# Patient Record
Sex: Male | Born: 1950 | Race: White | Hispanic: No | Marital: Married | Smoking: Never smoker
Health system: Southern US, Community
[De-identification: ages and names within clinical notes are randomized; demographics above are authoritative.]

## PROBLEM LIST (undated history)

## (undated) DIAGNOSIS — N189 Chronic kidney disease, unspecified: Secondary | ICD-10-CM

## (undated) DIAGNOSIS — M109 Gout, unspecified: Secondary | ICD-10-CM

## (undated) DIAGNOSIS — T8859XA Other complications of anesthesia, initial encounter: Secondary | ICD-10-CM

## (undated) DIAGNOSIS — E669 Obesity, unspecified: Secondary | ICD-10-CM

## (undated) DIAGNOSIS — E119 Type 2 diabetes mellitus without complications: Secondary | ICD-10-CM

## (undated) DIAGNOSIS — I2589 Other forms of chronic ischemic heart disease: Secondary | ICD-10-CM

## (undated) DIAGNOSIS — T4145XA Adverse effect of unspecified anesthetic, initial encounter: Secondary | ICD-10-CM

## (undated) DIAGNOSIS — G4733 Obstructive sleep apnea (adult) (pediatric): Secondary | ICD-10-CM

## (undated) DIAGNOSIS — Z87442 Personal history of urinary calculi: Secondary | ICD-10-CM

## (undated) DIAGNOSIS — I2585 Chronic coronary microvascular dysfunction: Secondary | ICD-10-CM

## (undated) DIAGNOSIS — C801 Malignant (primary) neoplasm, unspecified: Secondary | ICD-10-CM

## (undated) DIAGNOSIS — I10 Essential (primary) hypertension: Secondary | ICD-10-CM

## (undated) DIAGNOSIS — E785 Hyperlipidemia, unspecified: Secondary | ICD-10-CM

## (undated) DIAGNOSIS — K219 Gastro-esophageal reflux disease without esophagitis: Secondary | ICD-10-CM

## (undated) DIAGNOSIS — C61 Malignant neoplasm of prostate: Secondary | ICD-10-CM

## (undated) HISTORY — DX: Essential (primary) hypertension: I10

## (undated) HISTORY — DX: Obstructive sleep apnea (adult) (pediatric): G47.33

## (undated) HISTORY — DX: Hyperlipidemia, unspecified: E78.5

## (undated) HISTORY — DX: Type 2 diabetes mellitus without complications: E11.9

## (undated) HISTORY — PX: PROSTATE SURGERY: SHX751

## (undated) HISTORY — DX: Obesity, unspecified: E66.9

---

## 1975-03-17 HISTORY — PX: CYST REMOVAL TRUNK: SHX6283

## 2003-03-17 HISTORY — PX: NECK SURGERY: SHX720

## 2003-04-03 ENCOUNTER — Ambulatory Visit (HOSPITAL_COMMUNITY): Admission: RE | Admit: 2003-04-03 | Discharge: 2003-04-04 | Payer: Self-pay | Admitting: Neurosurgery

## 2004-03-18 ENCOUNTER — Inpatient Hospital Stay: Payer: Self-pay | Admitting: Internal Medicine

## 2008-08-10 ENCOUNTER — Ambulatory Visit: Payer: Self-pay | Admitting: General Practice

## 2009-05-09 ENCOUNTER — Ambulatory Visit: Payer: Self-pay | Admitting: Family Medicine

## 2009-09-27 ENCOUNTER — Ambulatory Visit: Payer: Self-pay | Admitting: Urology

## 2009-10-29 ENCOUNTER — Ambulatory Visit: Payer: Self-pay | Admitting: Urology

## 2009-11-05 ENCOUNTER — Ambulatory Visit: Payer: Self-pay | Admitting: Urology

## 2009-11-12 ENCOUNTER — Ambulatory Visit: Payer: Self-pay | Admitting: Cardiovascular Disease

## 2010-08-01 ENCOUNTER — Ambulatory Visit: Payer: Self-pay | Admitting: Family Medicine

## 2012-10-21 DIAGNOSIS — E291 Testicular hypofunction: Secondary | ICD-10-CM | POA: Insufficient documentation

## 2012-10-21 DIAGNOSIS — N320 Bladder-neck obstruction: Secondary | ICD-10-CM | POA: Insufficient documentation

## 2012-10-21 DIAGNOSIS — N529 Male erectile dysfunction, unspecified: Secondary | ICD-10-CM | POA: Insufficient documentation

## 2012-10-21 DIAGNOSIS — C61 Malignant neoplasm of prostate: Secondary | ICD-10-CM | POA: Insufficient documentation

## 2012-10-21 DIAGNOSIS — R339 Retention of urine, unspecified: Secondary | ICD-10-CM | POA: Insufficient documentation

## 2013-06-14 ENCOUNTER — Emergency Department: Payer: Self-pay | Admitting: Internal Medicine

## 2014-09-21 ENCOUNTER — Other Ambulatory Visit: Payer: Self-pay | Admitting: Family Medicine

## 2014-12-17 ENCOUNTER — Encounter: Payer: Self-pay | Admitting: Family Medicine

## 2014-12-17 ENCOUNTER — Telehealth: Payer: Self-pay | Admitting: Family Medicine

## 2014-12-17 NOTE — Telephone Encounter (Signed)
Patient tried to schedule appointment for today but dr Rutherford Nail was completely booked. He is requesting a refill prescription for vyvanse and citalopram.

## 2014-12-20 ENCOUNTER — Telehealth: Payer: Self-pay

## 2014-12-20 NOTE — Telephone Encounter (Signed)
Pt called back to check on the status of a refill request he had made. I explained to pt that you had not seen him recently and that you have not written script for medications that he was requesting since 2014. He stated that he had been getting it filled some where else. And waiting a week to get an appointment was too long so he may look for another physician and to inform you.

## 2015-01-01 ENCOUNTER — Encounter: Payer: Self-pay | Admitting: Family Medicine

## 2015-01-01 ENCOUNTER — Ambulatory Visit (INDEPENDENT_AMBULATORY_CARE_PROVIDER_SITE_OTHER): Payer: Self-pay | Admitting: Family Medicine

## 2015-01-01 VITALS — BP 122/64 | HR 75 | Temp 97.9°F | Resp 16 | Ht 75.0 in | Wt 249.4 lb

## 2015-01-01 DIAGNOSIS — F419 Anxiety disorder, unspecified: Secondary | ICD-10-CM

## 2015-01-01 DIAGNOSIS — F909 Attention-deficit hyperactivity disorder, unspecified type: Secondary | ICD-10-CM

## 2015-01-01 DIAGNOSIS — F988 Other specified behavioral and emotional disorders with onset usually occurring in childhood and adolescence: Secondary | ICD-10-CM

## 2015-01-01 DIAGNOSIS — K580 Irritable bowel syndrome with diarrhea: Secondary | ICD-10-CM

## 2015-01-01 MED ORDER — LISDEXAMFETAMINE DIMESYLATE 70 MG PO CAPS: 70.0000 mg | ORAL_CAPSULE | Freq: Every day | ORAL | Status: DC

## 2015-01-01 MED ORDER — CITALOPRAM HYDROBROMIDE 20 MG PO TABS: 20.0000 mg | ORAL_TABLET | Freq: Every day | ORAL | Status: DC

## 2015-01-01 NOTE — Patient Instructions (Signed)
Irritable Bowel Syndrome, Adult Irritable bowel syndrome (IBS) is not one specific disease. It is a group of symptoms that affects the organs responsible for digestion (gastrointestinal or GI tract).  To regulate how your GI tract works, your body sends signals back and forth between your intestines and your brain. If you have IBS, there may be a problem with these signals. As a result, your GI tract does not function normally. Your intestines may become more sensitive and overreact to certain things. This is especially true when you eat certain foods or when you are under stress.  There are four types of IBS. These may be determined based on the consistency of your stool:   IBS with diarrhea.   IBS with constipation.   Mixed IBS.   Unsubtyped IBS.  It is important to know which type of IBS you have. Some treatments are more likely to be helpful for certain types of IBS.  CAUSES  The exact cause of IBS is not known. RISK FACTORS You may have a higher risk of IBS if:  You are a woman.  You are younger than 62 years old.  You have a family history of IBS.  You have mental health problems.  You have had bacterial infection of your GI tract. SIGNS AND SYMPTOMS  Symptoms of IBS vary from person to person. The main symptom is abdominal pain or discomfort. Additional symptoms usually include one or more of the following:   Diarrhea, constipation, or both.   Abdominal swelling or bloating.   Feeling full or sick after eating a small or regular-size meal.   Frequent gas.   Mucus in the stool.   A feeling of having more stool left after a bowel movement.  Symptoms tend to come and go. They may be associated with stress, psychiatric conditions, or nothing at all.  DIAGNOSIS  There is no specific test to diagnose IBS. Your health care provider will make a diagnosis based on a physical exam, medical history, and your symptoms. You may have other tests to rule out other  conditions that may be causing your symptoms. These may include:   Blood tests.   X-rays.   CT scan.  Endoscopy and colonoscopy. This is a test in which your GI tract is viewed with a long, thin, flexible tube. TREATMENT There is no cure for IBS, but treatment can help relieve symptoms. IBS treatment often includes:   Changes to your diet, such as:  Eating more fiber.  Avoiding foods that cause symptoms.  Drinking more water.  Eating regular, medium-sized portioned meals.  Medicines. These may include:  Fiber supplements if you have constipation.  Medicine to control diarrhea (antidiarrheal medicines).  Medicine to help control muscle spasms in your GI tract (antispasmodic medicines).  Medicines to help with any mental health issues, such as antidepressants or tranquilizers.  Therapy.  Talk therapy may help with anxiety, depression, or other mental health issues that can make IBS symptoms worse.  Stress reduction.  Managing your stress can help keep symptoms under control. HOME CARE INSTRUCTIONS   Take medicines only as directed by your health care provider.  Eat a healthy diet.  Avoid foods and drinks with added sugar.  Include more whole grains, fruits, and vegetables gradually into your diet. This may be especially helpful if you have IBS with constipation.  Avoid any foods and drinks that make your symptoms worse. These may include dairy products and caffeinated or carbonated drinks.  Do not eat large meals.    Drink enough fluid to keep your urine clear or pale yellow.  Exercise regularly. Ask your health care provider for recommendations of good activities for you.  Keep all follow-up visits as directed by your health care provider. This is important. SEEK MEDICAL CARE IF:   You have constant pain.  You have trouble or pain with swallowing.  You have worsening diarrhea. SEEK IMMEDIATE MEDICAL CARE IF:   You have severe and worsening abdominal  pain.   You have diarrhea and:   You have a rash, stiff neck, or severe headache.   You are irritable, sleepy, or difficult to awaken.   You are weak, dizzy, or extremely thirsty.   You have bright red blood in your stool or you have black tarry stools.   You have unusual abdominal swelling that is painful.   You vomit continuously.   You vomit blood (hematemesis).   You have both abdominal pain and a fever.    This information is not intended to replace advice given to you by your health care provider. Make sure you discuss any questions you have with your health care provider.   Document Released: 03/02/2005 Document Revised: 03/23/2014 Document Reviewed: 11/17/2013 Elsevier Interactive Patient Education 2016 [employer]  

## 2015-01-01 NOTE — Telephone Encounter (Signed)
Patient had appointment for today.  

## 2015-01-01 NOTE — Progress Notes (Addendum)
Name: Scott Skinner   MRN: [id number]    DOB: 1950-11-29   Date:01/01/2015       Progress Note  Subjective  Chief Complaint  Chief Complaint  Patient presents with  . ADD    pt here to discuss ADD medications    HPI  Attention deficit disorder  Patient has been on Vyvanse in the past for his ADD. This is working effectively abdomen to be able to focus and concentrate on his work and other responsibilities. There've been no insomnia. Evidence of mild decrease in his appetite which is welcome. IN THE PAST SHE'S ALSO BEEN ON ATIVAN OR LORAZEPAM AND THAT WAS TAPERED OFF BY HIS MOST RECENT PSYCHIATRIST. HE NOW HAS NO INSURANCE HIS WISHES TO HAVE his medications filled at this office  Irritable bowel syndrome  Patient has a long-standing history of intermittent bloating and gas diarrhea alternating with formed stools. There's been no mucus no blood. He is up-to-date on colonoscopy. Is independent better when he was on citalopram for mild anxiety.  Anxiety  Patient remotely had been on alprazolam for anxiety by a psychiatrist. This is discontinued and was placed on citalopram I second psychiatrist. He states this worked effectively and is here bowel syndrome been better during that time as    Past Medical History  Diagnosis Date  . Diabetes mellitus without complication ([place])   . Hyperlipidemia   . Hypertension   . OSA (obstructive sleep apnea)   . Obesity     Social History  Substance Use Topics  . Smoking status: Never Smoker   . Smokeless tobacco: Not on file  . Alcohol Use: No     Current outpatient prescriptions:  .  amLODipine (NORVASC) 10 MG tablet, take 1 tablet by mouth once daily, Disp: 30 tablet, Rfl: 1 .  benazepril (LOTENSIN) 20 MG tablet, , Disp: , Rfl: 0 .  Calcium Carbonate-Vitamin D (CALCIUM-VITAMIN D) 500-200 MG-UNIT tablet, Take 1 tablet by mouth daily., Disp: , Rfl:  .  esomeprazole (NEXIUM) 20 MG capsule, Take 20 mg by mouth daily at 12 noon., Disp:  , Rfl:  .  Grape Seed Extract 100 MG CAPS, Take by mouth., Disp: , Rfl:  .  loratadine (CLARITIN) 10 MG tablet, Take 10 mg by mouth daily., Disp: , Rfl:  .  metFORMIN (GLUCOPHAGE) 1000 MG tablet, , Disp: , Rfl: 0 .  Omega-3 1000 MG CAPS, Take by mouth., Disp: , Rfl:  .  Red Yeast Rice Extract 600 MG CAPS, Take by mouth., Disp: , Rfl:   Allergies  Allergen Reactions  . Other Anaphylaxis    ANESTHESIA     Review of Systems  Constitutional: Negative for fever, chills and weight loss.  HENT: Negative for congestion, hearing loss, sore throat and tinnitus.   Eyes: Negative for blurred vision, double vision and redness.  Respiratory: Negative for cough, hemoptysis and shortness of breath.   Cardiovascular: Negative for chest pain, palpitations, orthopnea, claudication and leg swelling.  Gastrointestinal: Positive for diarrhea. Negative for heartburn, nausea, vomiting, constipation and blood in stool.       Bloating and gaseous status recurrent  Genitourinary: Negative for dysuria, urgency, frequency and hematuria.  Musculoskeletal: Negative for myalgias, back pain, joint pain, falls and neck pain.  Skin: Negative for itching.  Neurological: Negative for dizziness, tingling, tremors, focal weakness, seizures, loss of consciousness, weakness and headaches.  Endo/Heme/Allergies: Does not bruise/bleed easily.  Psychiatric/Behavioral: Negative for depression and substance abuse. The patient is not nervous/anxious and does not  have insomnia.      Objective  Filed Vitals:   01/01/15 0930  BP: 122/64  Pulse: 75  Temp: 97.9 F (36.6 C)  Resp: 16  Height: 6\' 3"  (1.905 m)  Weight: 249 lb 7 oz (113.144 kg)  SpO2: 96%     Physical Exam  Constitutional: He is oriented to person, place, and time and well-developed, well-nourished, and in no distress.  Obese male in no acute distress  HENT:  Head: Normocephalic.  Eyes: EOM are normal. Pupils are equal, round, and reactive to light.   Neck: Normal range of motion. Neck supple. No thyromegaly present.  Cardiovascular: Normal rate, regular rhythm and normal heart sounds.   No murmur heard. Pulmonary/Chest: Effort normal and breath sounds normal. No respiratory distress. He has no wheezes.  Musculoskeletal: Normal range of motion. He exhibits no edema.  Lymphadenopathy:    He has no cervical adenopathy.  Neurological: He is alert and oriented to person, place, and time. No cranial nerve deficit. Gait normal. Coordination normal.  No tremor  Skin: Skin is warm and dry. No rash noted.  Psychiatric: Affect and judgment normal.  Mildly anxious and loquacious      Assessment & Plan   1. ADD (attention deficit disorder) Well-controlled on medication - lisdexamfetamine (VYVANSE) 70 MG capsule; Take 1 capsule (70 mg total) by mouth daily.  Dispense: 30 capsule; Refill: 0 - citalopram (CELEXA) 20 MG tablet; Take 1 tablet (20 mg total) by mouth daily.  Dispense: 30 tablet; Refill: 5  2. Acute anxiety Requiring higher dosage of citalopram  3. Irritable bowel syndrome with diarrhea Handout on IBS

## 2015-01-29 ENCOUNTER — Ambulatory Visit: Payer: Self-pay | Admitting: Family Medicine

## 2015-02-21 ENCOUNTER — Other Ambulatory Visit: Payer: Self-pay | Admitting: Family Medicine

## 2015-03-29 ENCOUNTER — Other Ambulatory Visit: Payer: Self-pay | Admitting: Family Medicine

## 2015-04-01 ENCOUNTER — Encounter: Payer: Self-pay | Admitting: Family Medicine

## 2015-04-03 ENCOUNTER — Ambulatory Visit: Payer: PRIVATE HEALTH INSURANCE | Admitting: Family Medicine

## 2015-04-23 ENCOUNTER — Encounter: Payer: Self-pay | Admitting: Family Medicine

## 2015-04-23 ENCOUNTER — Ambulatory Visit (INDEPENDENT_AMBULATORY_CARE_PROVIDER_SITE_OTHER): Payer: Self-pay | Admitting: Family Medicine

## 2015-04-23 VITALS — BP 122/70 | HR 86 | Temp 98.1°F | Resp 19 | Ht 75.0 in | Wt 249.7 lb

## 2015-04-23 DIAGNOSIS — R1032 Left lower quadrant pain: Secondary | ICD-10-CM

## 2015-04-23 NOTE — Progress Notes (Signed)
Name: Scott Skinner   MRN: [id number]    DOB: 1950-12-27   Date:04/23/2015       Progress Note  Subjective  Chief Complaint  Chief Complaint  Patient presents with  . Follow-up    stomach issues  . Diabetes  . Hypertension    Abdominal Pain This is a chronic problem. The pain is located in the LLQ and suprapubic region (pain below the navel,). The pain is at a severity of 6/10. The quality of the pain is sharp. The abdominal pain radiates to the RLQ and periumbilical region. Associated symptoms include belching, constipation, diarrhea (yesterday, he went to the bathroom 4 times), flatus, melena (had black colored stool when he took a lot of PeptoBismol) and nausea. Pertinent negatives include no fever, vomiting or weight loss. The pain is aggravated by eating (eating spicy or greasy foods makes it worse.). He has tried proton pump inhibitors (Has taken Esomeprazole OTC, Imodium for diarrhea, PeptoBismol to curb the nausea) for the symptoms. There is no history of colon cancer, GERD or irritable bowel syndrome.    Past Medical History  Diagnosis Date  . Diabetes mellitus without complication ([place])   . Hyperlipidemia   . Hypertension   . OSA (obstructive sleep apnea)   . Obesity     Past Surgical History  Procedure Laterality Date  . Prostate surgery    . Neck surgery  2005  . Cyst removal trunk  1977    Family History  Problem Relation Age of Onset  . Diabetes Mother   . Liver cancer Mother     Social History   Social History  . Marital Status: Single    Spouse Name: N/A  . Number of Children: N/A  . Years of Education: N/A   Occupational History  . Not on file.   Social History Main Topics  . Smoking status: Never Smoker   . Smokeless tobacco: Not on file  . Alcohol Use: No  . Drug Use: No  . Sexual Activity: Not on file   Other Topics Concern  . Not on file   Social History Narrative     Current outpatient prescriptions:  .  amLODipine (NORVASC) 5  MG tablet, take 1 tablet by mouth once daily, Disp: 30 tablet, Rfl: 1 .  benazepril (LOTENSIN) 20 MG tablet, , Disp: , Rfl: 0 .  Calcium Carbonate-Vitamin D (CALCIUM-VITAMIN D) 500-200 MG-UNIT tablet, Take 1 tablet by mouth daily., Disp: , Rfl:  .  esomeprazole (NEXIUM) 20 MG capsule, Take 20 mg by mouth daily at 12 noon., Disp: , Rfl:  .  Grape Seed Extract 100 MG CAPS, Take by mouth., Disp: , Rfl:  .  lisdexamfetamine (VYVANSE) 70 MG capsule, Take 1 capsule (70 mg total) by mouth daily., Disp: 30 capsule, Rfl: 0 .  lisdexamfetamine (VYVANSE) 70 MG capsule, Take 1 capsule (70 mg total) by mouth daily., Disp: 30 capsule, Rfl: 0 .  lisdexamfetamine (VYVANSE) 70 MG capsule, Take 1 capsule (70 mg total) by mouth daily., Disp: 30 capsule, Rfl: 0 .  loratadine (CLARITIN) 10 MG tablet, Take 10 mg by mouth daily., Disp: , Rfl:  .  metFORMIN (GLUCOPHAGE) 1000 MG tablet, take 1 tablet by mouth twice a day, Disp: 60 tablet, Rfl: 5 .  Omega-3 1000 MG CAPS, Take by mouth., Disp: , Rfl:  .  Red Yeast Rice Extract 600 MG CAPS, Take by mouth., Disp: , Rfl:   Allergies  Allergen Reactions  . Other Anaphylaxis  ANESTHESIA      Review of Systems  Constitutional: Negative for fever, chills and weight loss.  Gastrointestinal: Positive for nausea, abdominal pain, diarrhea (yesterday, he went to the bathroom 4 times), constipation, melena (had black colored stool when he took a lot of PeptoBismol) and flatus. Negative for vomiting.    Objective  Filed Vitals:   04/23/15 0936  BP: 122/70  Pulse: 86  Temp: 98.1 F (36.7 C)  TempSrc: Oral  Resp: 19  Height: 6\' 3"  (1.905 m)  Weight: 249 lb 11.2 oz (113.263 kg)  SpO2: 95%    Physical Exam  Constitutional: He is oriented to person, place, and time and well-developed, well-nourished, and in no distress.  Abdominal: Normal appearance and bowel sounds are normal. There is tenderness in the right lower quadrant, suprapubic area and left lower quadrant.  There is no rigidity and no guarding.    Umbilical hernia visualized  Genitourinary: Rectal exam shows external hemorrhoid. Guaiac negative stool.  Neurological: He is alert and oriented to person, place, and time.  Nursing note and vitals reviewed.    Assessment & Plan  1. Abdominal pain, LLQ (left lower quadrant) DDx includes diverticulitis versus flatus.we will obtain CT scan of abdomen and pelvis without contrast. - CT Abdomen Pelvis Wo Contrast; Future - CBC with Differential - Comprehensive Metabolic Panel (CMET)   Danni Shima Asad A. [facility] 04/23/2015 9:51 AM

## 2015-04-24 LAB — CBC WITH DIFFERENTIAL/PLATELET
BASOS ABS: 0 10*3/uL (ref 0.0–0.2)
Basos: 0 %
EOS (ABSOLUTE): 0.5 10*3/uL — ABNORMAL HIGH (ref 0.0–0.4)
Eos: 7 %
Hematocrit: 45.9 % (ref 37.5–51.0)
Hemoglobin: 15.7 g/dL (ref 12.6–17.7)
Immature Grans (Abs): 0 10*3/uL (ref 0.0–0.1)
Immature Granulocytes: 0 %
LYMPHS ABS: 2.3 10*3/uL (ref 0.7–3.1)
Lymphs: 32 %
MCH: 31.9 pg (ref 26.6–33.0)
MCHC: 34.2 g/dL (ref 31.5–35.7)
MCV: 93 fL (ref 79–97)
MONOS ABS: 0.5 10*3/uL (ref 0.1–0.9)
Monocytes: 7 %
Neutrophils Absolute: 3.8 10*3/uL (ref 1.4–7.0)
Neutrophils: 54 %
Platelets: 243 10*3/uL (ref 150–379)
RBC: 4.92 x10E6/uL (ref 4.14–5.80)
RDW: 13.7 % (ref 12.3–15.4)
WBC: 7.1 10*3/uL (ref 3.4–10.8)

## 2015-04-24 LAB — COMPREHENSIVE METABOLIC PANEL
ALK PHOS: 67 IU/L (ref 39–117)
ALT: 54 IU/L — ABNORMAL HIGH (ref 0–44)
AST: 31 IU/L (ref 0–40)
Albumin/Globulin Ratio: 1.9 (ref 1.1–2.5)
Albumin: 4.5 g/dL (ref 3.6–4.8)
BILIRUBIN TOTAL: 0.5 mg/dL (ref 0.0–1.2)
BUN/Creatinine Ratio: 12 (ref 10–22)
BUN: 14 mg/dL (ref 8–27)
CHLORIDE: 97 mmol/L (ref 96–106)
CO2: 25 mmol/L (ref 18–29)
CREATININE: 1.14 mg/dL (ref 0.76–1.27)
Calcium: 10.7 mg/dL — ABNORMAL HIGH (ref 8.6–10.2)
GFR calc Af Amer: 79 mL/min/{1.73_m2} (ref >59)
GFR calc non Af Amer: 69 mL/min/{1.73_m2} (ref >59)
GLOBULIN, TOTAL: 2.4 g/dL (ref 1.5–4.5)
GLUCOSE: 277 mg/dL — AB (ref 65–99)
Potassium: 5.7 mmol/L — ABNORMAL HIGH (ref 3.5–5.2)
SODIUM: 138 mmol/L (ref 134–144)
Total Protein: 6.9 g/dL (ref 6.0–8.5)

## 2015-04-29 ENCOUNTER — Telehealth: Payer: Self-pay

## 2015-04-29 NOTE — Telephone Encounter (Signed)
Routed to Dr. Manuella Ghazi for Medication problems

## 2015-04-29 NOTE — Telephone Encounter (Signed)
Discussed at patient's acute visit last week. Patient sees Dr. Lucita Lora. He may schedule an appointment to discuss changing benazepril to a different agent.

## 2015-04-29 NOTE — Telephone Encounter (Signed)
Spoke with patient and informed him of his CT appt this Thursday, while on the phone he asked me to have someone call him about his Medication, Benazepril, it is causing a cough. Please call pt. Thanks

## 2015-05-01 ENCOUNTER — Other Ambulatory Visit: Payer: Self-pay | Admitting: Family Medicine

## 2015-05-02 ENCOUNTER — Ambulatory Visit
Admission: RE | Admit: 2015-05-02 | Discharge: 2015-05-02 | Disposition: A | Discharge status: Home / Self Care | Payer: Self-pay | Source: Ambulatory Visit | Attending: Family Medicine | Admitting: Family Medicine

## 2015-05-02 DIAGNOSIS — K760 Fatty (change of) liver, not elsewhere classified: Secondary | ICD-10-CM | POA: Insufficient documentation

## 2015-05-02 DIAGNOSIS — K439 Ventral hernia without obstruction or gangrene: Secondary | ICD-10-CM | POA: Insufficient documentation

## 2015-05-02 DIAGNOSIS — N200 Calculus of kidney: Secondary | ICD-10-CM | POA: Insufficient documentation

## 2015-05-02 DIAGNOSIS — R1032 Left lower quadrant pain: Secondary | ICD-10-CM | POA: Insufficient documentation

## 2015-05-14 ENCOUNTER — Encounter: Payer: Self-pay | Admitting: Family Medicine

## 2015-05-14 ENCOUNTER — Ambulatory Visit (INDEPENDENT_AMBULATORY_CARE_PROVIDER_SITE_OTHER): Payer: Self-pay | Admitting: Family Medicine

## 2015-05-14 VITALS — BP 128/76 | HR 83 | Temp 98.7°F | Resp 18 | Ht 75.0 in | Wt 252.1 lb

## 2015-05-14 DIAGNOSIS — IMO0001 Reserved for inherently not codable concepts without codable children: Secondary | ICD-10-CM

## 2015-05-14 DIAGNOSIS — R1030 Lower abdominal pain, unspecified: Secondary | ICD-10-CM

## 2015-05-14 DIAGNOSIS — E1165 Type 2 diabetes mellitus with hyperglycemia: Secondary | ICD-10-CM

## 2015-05-14 DIAGNOSIS — Z23 Encounter for immunization: Secondary | ICD-10-CM

## 2015-05-14 DIAGNOSIS — I10 Essential (primary) hypertension: Secondary | ICD-10-CM | POA: Insufficient documentation

## 2015-05-14 LAB — POCT GLYCOSYLATED HEMOGLOBIN (HGB A1C): HEMOGLOBIN A1C: 9.3

## 2015-05-14 LAB — GLUCOSE, POCT (MANUAL RESULT ENTRY): POC GLUCOSE: 344 mg/dL — AB (ref 70–99)

## 2015-05-14 MED ORDER — LOSARTAN POTASSIUM 25 MG PO TABS: 25.0000 mg | ORAL_TABLET | Freq: Every day | ORAL | Status: DC

## 2015-05-14 MED ORDER — CANAGLIFLOZIN 100 MG PO TABS: 100.0000 mg | ORAL_TABLET | Freq: Every day | ORAL | Status: DC

## 2015-05-14 NOTE — Progress Notes (Signed)
Name: Scott Skinner   MRN: [id number]    DOB: [birth year]   Date:05/14/2015       Progress Note  Subjective  Chief Complaint  Chief Complaint  Patient presents with  . Diabetes    pt here to discuss medication changes  . Abdominal Pain    LLQ follow up    Diabetes He presents for his follow-up diabetic visit. He has type 2 diabetes mellitus. His disease course has been worsening. Pertinent negatives for diabetes include no chest pain and no weight loss. Current diabetic treatment includes oral agent (monotherapy). Home blood sugar record trend: Not checking his Blood Glucose. An ACE inhibitor/angiotensin II receptor blocker is being taken.  Abdominal Pain This is a recurrent problem. The onset quality is gradual. The problem occurs intermittently. The problem has been unchanged. The pain is located in the periumbilical region. Associated symptoms include nausea. Pertinent negatives include no fever, melena, vomiting or weight loss. Exacerbated by: Certain foods especially spicy foods. He has tried proton pump inhibitors for the symptoms. Prior diagnostic workup includes CT scan.  Cough This is a chronic problem. Episode onset: 3-4 months. The cough is non-productive. Pertinent negatives include no chest pain, chills, fever or weight loss.     Past Medical History  Diagnosis Date  . Diabetes mellitus without complication ([place])   . Hyperlipidemia   . Hypertension   . OSA (obstructive sleep apnea)   . Obesity     Past Surgical History  Procedure Laterality Date  . Prostate surgery    . Neck surgery  2005  . Cyst removal trunk  1977    Family History  Problem Relation Age of Onset  . Diabetes Mother   . Liver cancer Mother     Social History   Social History  . Marital Status: Single    Spouse Name: N/A  . Number of Children: N/A  . Years of Education: N/A   Occupational History  . Not on file.   Social History Main Topics  . Smoking status: Never Smoker   .  Smokeless tobacco: Not on file  . Alcohol Use: No  . Drug Use: No  . Sexual Activity: Not on file   Other Topics Concern  . Not on file   Social History Narrative     Current outpatient prescriptions:  .  amLODipine (NORVASC) 5 MG tablet, take 1 tablet by mouth once daily, Disp: 30 tablet, Rfl: 1 .  Calcium Carbonate-Vitamin D (CALCIUM-VITAMIN D) 500-200 MG-UNIT tablet, Take 1 tablet by mouth daily., Disp: , Rfl:  .  esomeprazole (NEXIUM) 20 MG capsule, Take 20 mg by mouth daily at 12 noon., Disp: , Rfl:  .  Grape Seed Extract 100 MG CAPS, Take by mouth., Disp: , Rfl:  .  lisdexamfetamine (VYVANSE) 70 MG capsule, Take 1 capsule (70 mg total) by mouth daily., Disp: 30 capsule, Rfl: 0 .  lisdexamfetamine (VYVANSE) 70 MG capsule, Take 1 capsule (70 mg total) by mouth daily., Disp: 30 capsule, Rfl: 0 .  lisdexamfetamine (VYVANSE) 70 MG capsule, Take 1 capsule (70 mg total) by mouth daily., Disp: 30 capsule, Rfl: 0 .  loratadine (CLARITIN) 10 MG tablet, Take 10 mg by mouth daily., Disp: , Rfl:  .  losartan (COZAAR) 25 MG tablet, Take 1 tablet (25 mg total) by mouth daily., Disp: 30 tablet, Rfl: 0 .  metFORMIN (GLUCOPHAGE) 1000 MG tablet, take 1 tablet by mouth twice a day, Disp: 60 tablet, Rfl: 5 .  Omega-3 1000  MG CAPS, Take by mouth., Disp: , Rfl:  .  Red Yeast Rice Extract 600 MG CAPS, Take by mouth., Disp: , Rfl:   Allergies  Allergen Reactions  . Other Anaphylaxis    ANESTHESIA      Review of Systems  Constitutional: Negative for fever, chills and weight loss.  Respiratory: Positive for cough.   Cardiovascular: Negative for chest pain.  Gastrointestinal: Positive for nausea and abdominal pain. Negative for vomiting and melena.     Objective  Filed Vitals:   05/14/15 1058  BP: 128/76  Pulse: 83  Temp: 98.7 F (37.1 C)  Resp: 18  Height: 6\' 3"  (1.905 m)  Weight: 252 lb 1 oz (114.335 kg)  SpO2: 97%    Physical Exam  Constitutional: He is oriented to person, place,  and time and well-developed, well-nourished, and in no distress.  HENT:  Head: Normocephalic and atraumatic.  Cardiovascular: Normal rate and regular rhythm.   Pulmonary/Chest: Effort normal and breath sounds normal.  Abdominal: Soft. Bowel sounds are normal. There is no tenderness.  Neurological: He is alert and oriented to person, place, and time.  Psychiatric: Mood, memory, affect and judgment normal.  Nursing note and vitals reviewed.    Assessment & Plan  1. Need for influenza vaccination  - Flu Vaccine QUAD 36+ mos PF IM (Fluarix & Fluzone Quad PF)  2. Lower abdominal pain  CT scan of abdomen and pelvis is reviewed and discussed with patient in detail. Essentially unremarkable except for small kidney stones. Mild thickening of several loops of jejunum , enteritis, and fatty liver. Have discussed referral to GI but patient wants to wait for now.   3. Essential hypertension Replace benazepril with losartan due to concerns about chronic dry cough. Follow-up in one month. - losartan (COZAAR) 25 MG tablet; Take 1 tablet (25 mg total) by mouth daily.  Dispense: 30 tablet; Refill: 0   4. Uncontrolled type 2 diabetes mellitus without complication, without long-term current use of insulin (Scott Skinner)  long discussion with patient on worsening diabetes mellitus. Advised to improve his diet, cut down foods that are high sources of sugar , we will start on Invokana 100 mg daily , explained the mechanism of action. Will follow-up in one month with review of blood glucose logs. - Urine Microalbumin w/creat. ratio - POCT HgB A1C - POCT Glucose (CBG) - canagliflozin (INVOKANA) 100 MG TABS tablet; Take 1 tablet (100 mg total) by mouth daily before breakfast.  Dispense: 30 tablet; Refill: 2  5. Hypercalcemia  - PTH, Intact and Calcium   Scott Skinner Asad A. [facility] 05/14/2015 11:32 AM

## 2015-05-15 ENCOUNTER — Telehealth: Payer: Self-pay | Admitting: Family Medicine

## 2015-05-15 LAB — MICROALBUMIN / CREATININE URINE RATIO
CREATININE, UR: 54.7 mg/dL
MICROALB/CREAT RATIO: 15.7 mg/g{creat} (ref 0.0–30.0)
MICROALBUM., U, RANDOM: 8.6 ug/mL

## 2015-05-15 LAB — PTH, INTACT AND CALCIUM
Calcium: 9.7 mg/dL (ref 8.6–10.2)
PTH: 43 pg/mL (ref 15–65)

## 2015-05-15 NOTE — Telephone Encounter (Signed)
Pt states he was given a RX for Invokona and he states this will cost him over $400.00 to get this medication and wants to know what else he can take. Please advise pt.

## 2015-05-15 NOTE — Telephone Encounter (Signed)
Routed to Dr. Manuella Ghazi for medication advice

## 2015-05-15 NOTE — Telephone Encounter (Signed)
Please schedule patient for an appointment to change his medication. Please also request that patient check with his insurance on the coverage of antidiabetic medications and to bring a list of covered medicines with him to his appointment.

## 2015-05-17 ENCOUNTER — Other Ambulatory Visit: Payer: Self-pay | Admitting: Family Medicine

## 2015-05-17 MED ORDER — GLUCOSE BLOOD VI STRP: ORAL_STRIP | Status: AC

## 2015-05-22 ENCOUNTER — Other Ambulatory Visit: Payer: Self-pay | Admitting: Family Medicine

## 2015-05-22 MED ORDER — GLIMEPIRIDE 2 MG PO TABS: 2.0000 mg | ORAL_TABLET | Freq: Every day | ORAL | Status: DC

## 2015-05-22 NOTE — Telephone Encounter (Signed)
Patient stated Insurance would not cover [place]. Need new medication sent to pharmacy. Per Dr. Rutherford Nail patient can get Glimepride 2 mg qd. Script sent to pharmacy

## 2015-05-29 ENCOUNTER — Telehealth: Payer: Self-pay | Admitting: Family Medicine

## 2015-05-29 NOTE — Telephone Encounter (Signed)
Returned patient call and left a voicemail concerning lab results from 05/14/2015 and 04/23/2015

## 2015-05-29 NOTE — Telephone Encounter (Signed)
Pt is Dr Rutherford Nail pt but has been seeing Dr Manuella Ghazi, he has had some labs done a few times and has not received his results.Please advise pt.

## 2015-06-18 ENCOUNTER — Other Ambulatory Visit: Payer: Self-pay | Admitting: Family Medicine

## 2015-06-25 ENCOUNTER — Other Ambulatory Visit: Payer: Self-pay | Admitting: Family Medicine

## 2015-06-25 DIAGNOSIS — I10 Essential (primary) hypertension: Secondary | ICD-10-CM

## 2015-06-25 MED ORDER — LOSARTAN POTASSIUM 25 MG PO TABS: 25.0000 mg | ORAL_TABLET | Freq: Every day | ORAL | Status: DC

## 2015-07-01 ENCOUNTER — Ambulatory Visit: Payer: PRIVATE HEALTH INSURANCE | Admitting: Family Medicine

## 2015-07-10 ENCOUNTER — Other Ambulatory Visit: Payer: Self-pay | Admitting: Family Medicine

## 2015-07-30 ENCOUNTER — Ambulatory Visit (INDEPENDENT_AMBULATORY_CARE_PROVIDER_SITE_OTHER): Payer: Self-pay | Admitting: Family Medicine

## 2015-07-30 ENCOUNTER — Encounter: Payer: Self-pay | Admitting: Family Medicine

## 2015-07-30 VITALS — BP 138/80 | HR 100 | Temp 97.8°F | Resp 18 | Ht 75.0 in | Wt 241.8 lb

## 2015-07-30 DIAGNOSIS — F909 Attention-deficit hyperactivity disorder, unspecified type: Secondary | ICD-10-CM

## 2015-07-30 DIAGNOSIS — I10 Essential (primary) hypertension: Secondary | ICD-10-CM

## 2015-07-30 DIAGNOSIS — F988 Other specified behavioral and emotional disorders with onset usually occurring in childhood and adolescence: Secondary | ICD-10-CM

## 2015-07-30 MED ORDER — LOSARTAN POTASSIUM 25 MG PO TABS: 25.0000 mg | ORAL_TABLET | Freq: Every day | ORAL | Status: DC

## 2015-07-30 MED ORDER — LISDEXAMFETAMINE DIMESYLATE 70 MG PO CAPS: 70.0000 mg | ORAL_CAPSULE | ORAL | Status: DC

## 2015-07-30 MED ORDER — LISDEXAMFETAMINE DIMESYLATE 70 MG PO CAPS: 70.0000 mg | ORAL_CAPSULE | Freq: Every day | ORAL | Status: DC

## 2015-07-30 NOTE — Progress Notes (Signed)
Name: Scott Skinner   MRN: [id number]    DOB: Jul 26, 1950   Date:07/30/2015       Progress Note  Subjective  Chief Complaint  Chief Complaint  Patient presents with  . Hypertension    med refills  . ADHD    Vyvanse refills    Hypertension This is a chronic problem. The problem is controlled. Pertinent negatives include no blurred vision, chest pain, headaches, palpitations or shortness of breath. Past treatments include angiotensin blockers and calcium channel blockers.   Attention Deficit Disorder: Pt. Is here for refills on Vyvanse 70 mg as needed. He takes it mostly to stay focused, especially when he has a lot of things to accomplish and feels anxious as a result. He also takes Citalopram 20 mg daily as needed to 'take the edge off'.   Past Medical History  Diagnosis Date  . Diabetes mellitus without complication ([place])   . Hyperlipidemia   . Hypertension   . OSA (obstructive sleep apnea)   . Obesity     Past Surgical History  Procedure Laterality Date  . Prostate surgery    . Neck surgery  2005  . Cyst removal trunk  1977    Family History  Problem Relation Age of Onset  . Diabetes Mother   . Liver cancer Mother     Social History   Social History  . Marital Status: Single    Spouse Name: N/A  . Number of Children: N/A  . Years of Education: N/A   Occupational History  . Not on file.   Social History Main Topics  . Smoking status: Never Smoker   . Smokeless tobacco: Not on file  . Alcohol Use: No  . Drug Use: No  . Sexual Activity: Not on file   Other Topics Concern  . Not on file   Social History Narrative     Current outpatient prescriptions:  .  amLODipine (NORVASC) 5 MG tablet, take 1 tablet by mouth once daily, Disp: 30 tablet, Rfl: 1 .  Calcium Carbonate-Vitamin D (CALCIUM-VITAMIN D) 500-200 MG-UNIT tablet, Take 1 tablet by mouth daily., Disp: , Rfl:  .  canagliflozin (INVOKANA) 100 MG TABS tablet, Take 1 tablet (100 mg total) by mouth  daily before breakfast., Disp: 30 tablet, Rfl: 2 .  esomeprazole (NEXIUM) 20 MG capsule, Take 20 mg by mouth daily at 12 noon., Disp: , Rfl:  .  glimepiride (AMARYL) 2 MG tablet, Take 1 tablet (2 mg total) by mouth daily before breakfast., Disp: 30 tablet, Rfl: 3 .  glucose blood (ACCU-CHEK ACTIVE STRIPS) test strip, Use as instructed, Disp: 100 each, Rfl: 12 .  Grape Seed Extract 100 MG CAPS, Take by mouth., Disp: , Rfl:  .  lisdexamfetamine (VYVANSE) 70 MG capsule, Take 1 capsule (70 mg total) by mouth daily., Disp: 30 capsule, Rfl: 0 .  lisdexamfetamine (VYVANSE) 70 MG capsule, Take 1 capsule (70 mg total) by mouth daily., Disp: 30 capsule, Rfl: 0 .  lisdexamfetamine (VYVANSE) 70 MG capsule, Take 1 capsule (70 mg total) by mouth daily., Disp: 30 capsule, Rfl: 0 .  loratadine (CLARITIN) 10 MG tablet, Take 10 mg by mouth daily., Disp: , Rfl:  .  losartan (COZAAR) 25 MG tablet, Take 1 tablet (25 mg total) by mouth daily., Disp: 30 tablet, Rfl: 0 .  metFORMIN (GLUCOPHAGE) 1000 MG tablet, take 1 tablet by mouth twice a day, Disp: 60 tablet, Rfl: 5 .  Omega-3 1000 MG CAPS, Take by mouth., Disp: , Rfl:  .  Red Yeast Rice Extract 600 MG CAPS, Take by mouth., Disp: , Rfl:   Allergies  Allergen Reactions  . Other Anaphylaxis    ANESTHESIA     Review of Systems  Constitutional: Negative for fever and chills.  Eyes: Negative for blurred vision.  Respiratory: Negative for shortness of breath.   Cardiovascular: Negative for chest pain and palpitations.  Neurological: Negative for headaches.  Psychiatric/Behavioral: The patient is not nervous/anxious.      Objective  There were no vitals filed for this visit.  Physical Exam  Constitutional: He is oriented to person, place, and time and well-developed, well-nourished, and in no distress.  HENT:  Head: Normocephalic and atraumatic.  Cardiovascular: Normal rate and regular rhythm.   Pulmonary/Chest: Effort normal and breath sounds normal.   Neurological: He is alert and oriented to person, place, and time.  Psychiatric: Mood, memory, affect and judgment normal.  Nursing note and vitals reviewed.      Assessment & Plan  1. Essential hypertension Blood pressure stable and controlled on present therapy - losartan (COZAAR) 25 MG tablet; Take 1 tablet (25 mg total) by mouth daily.  Dispense: 30 tablet; Refill: 5  2. ADD (attention deficit disorder) Refills for Vyvanse provided, patient to follow-up with PCP. - lisdexamfetamine (VYVANSE) 70 MG capsule; Take 1 capsule (70 mg total) by mouth every morning.  Dispense: 30 capsule; Refill: 0 - lisdexamfetamine (VYVANSE) 70 MG capsule; Take 1 capsule (70 mg total) by mouth daily. Please fill on/after September 29, 2015  Dispense: 30 capsule; Refill: 0 - lisdexamfetamine (VYVANSE) 70 MG capsule; Take 1 capsule (70 mg total) by mouth every morning.  Dispense: 30 capsule; Refill: 0   Nhat Hearne [facility] 07/30/2015 4:17 PM

## 2015-08-05 ENCOUNTER — Telehealth: Payer: Self-pay | Admitting: Family Medicine

## 2015-08-05 NOTE — Telephone Encounter (Signed)
Pt needs to know if Buspar will conflict with his other meds. Please advice.

## 2015-08-06 NOTE — Telephone Encounter (Signed)
BuSpar may interact with Vyvanse and increase the risk of serotonin syndrome

## 2015-08-09 NOTE — Telephone Encounter (Signed)
Spoke with patient and notified him per Dr. Manuella Ghazi that the medication Buspar may interact with Vyvanse and increase the risk of serotonin syndrome, patient verbalized understanding

## 2015-09-05 ENCOUNTER — Ambulatory Visit: Payer: PRIVATE HEALTH INSURANCE | Admitting: Family Medicine

## 2015-09-15 ENCOUNTER — Other Ambulatory Visit: Payer: Self-pay | Admitting: Family Medicine

## 2015-09-16 ENCOUNTER — Telehealth: Payer: Self-pay

## 2015-09-16 MED ORDER — GLIMEPIRIDE 2 MG PO TABS: 2.0000 mg | ORAL_TABLET | Freq: Every day | ORAL | Status: DC

## 2015-09-16 MED ORDER — AMLODIPINE BESYLATE 5 MG PO TABS: 5.0000 mg | ORAL_TABLET | Freq: Every day | ORAL | Status: DC

## 2015-09-16 NOTE — Telephone Encounter (Signed)
Medication has been refilled and sent to [facility] 

## 2015-09-18 ENCOUNTER — Encounter: Payer: Self-pay | Admitting: Family Medicine

## 2015-09-27 ENCOUNTER — Telehealth: Payer: Self-pay

## 2015-09-27 NOTE — Telephone Encounter (Signed)
Patient requesting refill. 

## 2015-09-27 NOTE — Telephone Encounter (Signed)
This patient seen Dr. Manuella Ghazi last, could you please refill his medications.

## 2015-09-27 NOTE — Telephone Encounter (Signed)
Requesting Amaryl and Norvasc, he has seen you last

## 2015-09-30 ENCOUNTER — Other Ambulatory Visit: Payer: Self-pay | Admitting: Family Medicine

## 2015-09-30 NOTE — Telephone Encounter (Signed)
Patient will need an appointment for medication refill and follow-up

## 2015-10-01 NOTE — Telephone Encounter (Signed)
Patient was given a 90 day supply of both medications this month and have picked them up from the pharmacy. He is not sure why we are wanting him to come in, please advise

## 2015-10-07 ENCOUNTER — Other Ambulatory Visit: Payer: Self-pay

## 2015-10-08 MED ORDER — AMLODIPINE BESYLATE 5 MG PO TABS: 5.0000 mg | ORAL_TABLET | Freq: Every day | ORAL | 0 refills | Status: DC

## 2015-10-09 NOTE — Telephone Encounter (Signed)
Patient will need an appointment before Dr. Manuella Ghazi can fill his Glimepiride.

## 2015-11-05 ENCOUNTER — Telehealth: Payer: Self-pay | Admitting: Family Medicine

## 2015-11-05 NOTE — Telephone Encounter (Signed)
Please schedule this patient for an appointment to obtain lab work for diabetes and cholesterol

## 2015-11-12 ENCOUNTER — Ambulatory Visit (INDEPENDENT_AMBULATORY_CARE_PROVIDER_SITE_OTHER): Payer: Self-pay | Admitting: Family Medicine

## 2015-11-12 ENCOUNTER — Encounter: Payer: Self-pay | Admitting: Family Medicine

## 2015-11-12 VITALS — BP 132/77 | HR 80 | Temp 97.9°F | Resp 17 | Ht 75.0 in | Wt 246.8 lb

## 2015-11-12 DIAGNOSIS — I10 Essential (primary) hypertension: Secondary | ICD-10-CM

## 2015-11-12 DIAGNOSIS — IMO0001 Reserved for inherently not codable concepts without codable children: Secondary | ICD-10-CM

## 2015-11-12 DIAGNOSIS — F900 Attention-deficit hyperactivity disorder, predominantly inattentive type: Secondary | ICD-10-CM

## 2015-11-12 DIAGNOSIS — E1165 Type 2 diabetes mellitus with hyperglycemia: Secondary | ICD-10-CM

## 2015-11-12 DIAGNOSIS — F988 Other specified behavioral and emotional disorders with onset usually occurring in childhood and adolescence: Secondary | ICD-10-CM

## 2015-11-12 MED ORDER — LOSARTAN POTASSIUM 25 MG PO TABS: 25.0000 mg | ORAL_TABLET | Freq: Every day | ORAL | 1 refills | Status: DC

## 2015-11-12 MED ORDER — METFORMIN HCL 1000 MG PO TABS: 1000.0000 mg | ORAL_TABLET | Freq: Two times a day (BID) | ORAL | 1 refills | Status: DC

## 2015-11-12 MED ORDER — AMLODIPINE BESYLATE 5 MG PO TABS: 5.0000 mg | ORAL_TABLET | Freq: Every day | ORAL | 0 refills | Status: DC

## 2015-11-12 MED ORDER — LISDEXAMFETAMINE DIMESYLATE 50 MG PO CAPS: 50.0000 mg | ORAL_CAPSULE | ORAL | 0 refills | Status: DC

## 2015-11-12 NOTE — Progress Notes (Signed)
Name: Scott Skinner   MRN: [id number]    DOB: Sep 06, 1950   Date:11/12/2015       Progress Note  Subjective  Chief Complaint  Chief Complaint  Patient presents with  . Medication Refill    Diabetes  He presents for his follow-up diabetic visit. He has type 2 diabetes mellitus. Pertinent negatives for diabetes include no fatigue, no polydipsia and no polyuria. Current diabetic treatment includes oral agent (dual therapy). He is following a diabetic diet. His breakfast blood glucose range is generally 130-140 mg/dl. An ACE inhibitor/angiotensin II receptor blocker is being taken.    Attention Deficit Disorder: Pt. Presents for follow up of symptoms of Attention Deficit Disorder. Symptoms include difficulty focusing, easily distracted, anxiousness, which leads to stress. He is on Vyvanse 70 mg daily which helps relieve his symptoms but he feels as if its too strong and he cannot easily fall asleep at night. He denies any changes in appetite or chest pain.    Past Medical History:  Diagnosis Date  . Diabetes mellitus without complication ([place])   . Hyperlipidemia   . Hypertension   . Obesity   . OSA (obstructive sleep apnea)     Past Surgical History:  Procedure Laterality Date  . CYST REMOVAL TRUNK  1977  . NECK SURGERY  2005  . PROSTATE SURGERY      Family History  Problem Relation Age of Onset  . Diabetes Mother   . Liver cancer Mother     Social History   Social History  . Marital status: Single    Spouse name: N/A  . Number of children: N/A  . Years of education: N/A   Occupational History  . Not on file.   Social History Main Topics  . Smoking status: Never Smoker  . Smokeless tobacco: Never Used  . Alcohol use No  . Drug use: No  . Sexual activity: Not on file   Other Topics Concern  . Not on file   Social History Narrative  . No narrative on file     Current Outpatient Prescriptions:  .  amLODipine (NORVASC) 5 MG tablet, Take 1 tablet (5 mg  total) by mouth daily., Disp: 90 tablet, Rfl: 0 .  Calcium Carbonate-Vitamin D (CALCIUM-VITAMIN D) 500-200 MG-UNIT tablet, Take 1 tablet by mouth daily., Disp: , Rfl:  .  canagliflozin (INVOKANA) 100 MG TABS tablet, Take 1 tablet (100 mg total) by mouth daily before breakfast., Disp: 30 tablet, Rfl: 2 .  esomeprazole (NEXIUM) 20 MG capsule, Take 20 mg by mouth daily at 12 noon., Disp: , Rfl:  .  glimepiride (AMARYL) 2 MG tablet, Take 1 tablet (2 mg total) by mouth daily before breakfast., Disp: 90 tablet, Rfl: 0 .  glucose blood (ACCU-CHEK ACTIVE STRIPS) test strip, Use as instructed, Disp: 100 each, Rfl: 12 .  Grape Seed Extract 100 MG CAPS, Take by mouth., Disp: , Rfl:  .  lisdexamfetamine (VYVANSE) 70 MG capsule, Take 1 capsule (70 mg total) by mouth every morning., Disp: 30 capsule, Rfl: 0 .  lisdexamfetamine (VYVANSE) 70 MG capsule, Take 1 capsule (70 mg total) by mouth daily. Please fill on/after September 29, 2015, Disp: 30 capsule, Rfl: 0 .  lisdexamfetamine (VYVANSE) 70 MG capsule, Take 1 capsule (70 mg total) by mouth every morning., Disp: 30 capsule, Rfl: 0 .  loratadine (CLARITIN) 10 MG tablet, Take 10 mg by mouth daily., Disp: , Rfl:  .  losartan (COZAAR) 25 MG tablet, Take 1 tablet (25 mg  total) by mouth daily., Disp: 30 tablet, Rfl: 5 .  metFORMIN (GLUCOPHAGE) 1000 MG tablet, take 1 tablet by mouth twice a day, Disp: 60 tablet, Rfl: 5 .  Omega-3 1000 MG CAPS, Take by mouth., Disp: , Rfl:  .  Red Yeast Rice Extract 600 MG CAPS, Take by mouth., Disp: , Rfl:   Allergies  Allergen Reactions  . Other Anaphylaxis    ANESTHESIA  ANESTHESIA      Review of Systems  Constitutional: Negative for fatigue.  Gastrointestinal: Negative for abdominal pain, nausea and vomiting.  Endo/Heme/Allergies: Negative for polydipsia.    Objective  Vitals:   11/12/15 1106  BP: 132/77  Pulse: 80  Resp: 17  Temp: 97.9 F (36.6 C)  TempSrc: Oral  SpO2: 96%  Weight: 246 lb 12.8 oz (111.9 kg)   Height: 6\' 3"  (1.905 m)    Physical Exam  Constitutional: He is oriented to person, place, and time and well-developed, well-nourished, and in no distress.  HENT:  Head: Normocephalic and atraumatic.  Cardiovascular: Normal rate, regular rhythm, S1 normal, S2 normal and normal heart sounds.   No murmur heard. Pulmonary/Chest: Effort normal and breath sounds normal. He has no wheezes.  Abdominal: Soft. Bowel sounds are normal. There is no tenderness.  Musculoskeletal:       Right ankle: He exhibits no swelling.       Left ankle: He exhibits no swelling.  Neurological: He is alert and oriented to person, place, and time.  Psychiatric: Mood, memory, affect and judgment normal.  Nursing note and vitals reviewed.    Assessment & Plan  1. Essential hypertension  - amLODipine (NORVASC) 5 MG tablet; Take 1 tablet (5 mg total) by mouth daily.  Dispense: 90 tablet; Refill: 0 - losartan (COZAAR) 25 MG tablet; Take 1 tablet (25 mg total) by mouth daily.  Dispense: 90 tablet; Refill: 1  2. Uncontrolled type 2 diabetes mellitus without complication, without long-term current use of insulin ([place]) Patient will bring results of lab work obtained at his job. Continue on metformin - metFORMIN (GLUCOPHAGE) 1000 MG tablet; Take 1 tablet (1,000 mg total) by mouth 2 (two) times daily.  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 - lisdexamfetamine (VYVANSE) 50 MG capsule; Take 1 capsule (50 mg total) by mouth every morning.  Dispense: 30 capsule; Refill: 0   Scott Skinner [facility] 11/12/2015 11:17 AM

## 2015-12-25 ENCOUNTER — Other Ambulatory Visit: Payer: Self-pay

## 2015-12-31 ENCOUNTER — Encounter: Payer: Self-pay | Admitting: Family Medicine

## 2016-01-13 DIAGNOSIS — K219 Gastro-esophageal reflux disease without esophagitis: Secondary | ICD-10-CM | POA: Insufficient documentation

## 2016-02-11 ENCOUNTER — Other Ambulatory Visit: Payer: Self-pay | Admitting: Neurosurgery

## 2016-02-11 DIAGNOSIS — M5440 Lumbago with sciatica, unspecified side: Secondary | ICD-10-CM

## 2016-02-12 ENCOUNTER — Other Ambulatory Visit: Payer: Self-pay | Admitting: Family Medicine

## 2016-02-12 DIAGNOSIS — IMO0001 Reserved for inherently not codable concepts without codable children: Secondary | ICD-10-CM

## 2016-02-12 DIAGNOSIS — E1165 Type 2 diabetes mellitus with hyperglycemia: Principal | ICD-10-CM

## 2016-03-23 ENCOUNTER — Ambulatory Visit: Payer: Self-pay

## 2016-04-20 DIAGNOSIS — E782 Mixed hyperlipidemia: Secondary | ICD-10-CM | POA: Insufficient documentation

## 2016-04-23 ENCOUNTER — Emergency Department
Admission: EM | Admit: 2016-04-23 | Discharge: 2016-04-23 | Disposition: A | Discharge status: Home / Self Care | Payer: No Typology Code available for payment source | Attending: Emergency Medicine | Admitting: Emergency Medicine

## 2016-04-23 ENCOUNTER — Encounter: Payer: Self-pay | Admitting: Emergency Medicine

## 2016-04-23 ENCOUNTER — Emergency Department: Payer: No Typology Code available for payment source

## 2016-04-23 DIAGNOSIS — Y9389 Activity, other specified: Secondary | ICD-10-CM | POA: Diagnosis not present

## 2016-04-23 DIAGNOSIS — Y92410 Unspecified street and highway as the place of occurrence of the external cause: Secondary | ICD-10-CM | POA: Insufficient documentation

## 2016-04-23 DIAGNOSIS — E119 Type 2 diabetes mellitus without complications: Secondary | ICD-10-CM | POA: Diagnosis not present

## 2016-04-23 DIAGNOSIS — S4992XA Unspecified injury of left shoulder and upper arm, initial encounter: Secondary | ICD-10-CM | POA: Diagnosis present

## 2016-04-23 DIAGNOSIS — Z7984 Long term (current) use of oral hypoglycemic drugs: Secondary | ICD-10-CM | POA: Diagnosis not present

## 2016-04-23 DIAGNOSIS — Z79899 Other long term (current) drug therapy: Secondary | ICD-10-CM | POA: Insufficient documentation

## 2016-04-23 DIAGNOSIS — M25561 Pain in right knee: Secondary | ICD-10-CM | POA: Diagnosis not present

## 2016-04-23 DIAGNOSIS — M25512 Pain in left shoulder: Secondary | ICD-10-CM | POA: Diagnosis not present

## 2016-04-23 DIAGNOSIS — I10 Essential (primary) hypertension: Secondary | ICD-10-CM | POA: Diagnosis not present

## 2016-04-23 DIAGNOSIS — Y999 Unspecified external cause status: Secondary | ICD-10-CM | POA: Diagnosis not present

## 2016-04-23 MED ORDER — CYCLOBENZAPRINE HCL 5 MG PO TABS: 5.0000 mg | ORAL_TABLET | Freq: Three times a day (TID) | ORAL | 0 refills | Status: AC | PRN
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
## 2016-06-01 DIAGNOSIS — Z8659 Personal history of other mental and behavioral disorders: Secondary | ICD-10-CM | POA: Insufficient documentation

## 2016-07-06 ENCOUNTER — Emergency Department: Payer: No Typology Code available for payment source

## 2016-07-06 ENCOUNTER — Emergency Department
Admission: EM | Admit: 2016-07-06 | Discharge: 2016-07-06 | Disposition: A | Discharge status: Home / Self Care | Payer: No Typology Code available for payment source | Attending: Emergency Medicine | Admitting: Emergency Medicine

## 2016-07-06 DIAGNOSIS — M5030 Other cervical disc degeneration, unspecified cervical region: Secondary | ICD-10-CM

## 2016-07-06 DIAGNOSIS — Z79899 Other long term (current) drug therapy: Secondary | ICD-10-CM | POA: Diagnosis not present

## 2016-07-06 DIAGNOSIS — E119 Type 2 diabetes mellitus without complications: Secondary | ICD-10-CM | POA: Insufficient documentation

## 2016-07-06 DIAGNOSIS — M50321 Other cervical disc degeneration at C4-C5 level: Secondary | ICD-10-CM | POA: Diagnosis not present

## 2016-07-06 DIAGNOSIS — M50323 Other cervical disc degeneration at C6-C7 level: Secondary | ICD-10-CM | POA: Diagnosis not present

## 2016-07-06 DIAGNOSIS — M542 Cervicalgia: Secondary | ICD-10-CM | POA: Diagnosis present

## 2016-07-06 DIAGNOSIS — I10 Essential (primary) hypertension: Secondary | ICD-10-CM | POA: Insufficient documentation

## 2016-07-06 DIAGNOSIS — Z7984 Long term (current) use of oral hypoglycemic drugs: Secondary | ICD-10-CM | POA: Diagnosis not present

## 2016-07-06 MED ORDER — DICLOFENAC SODIUM 75 MG PO TBEC: 75.0000 mg | DELAYED_RELEASE_TABLET | Freq: Two times a day (BID) | ORAL | 1 refills | Status: DC
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
## 2016-10-05 ENCOUNTER — Encounter: Payer: Self-pay | Admitting: Anesthesiology

## 2016-10-05 ENCOUNTER — Ambulatory Visit
Admission: RE | Admit: 2016-10-05 | Discharge: 2016-10-05 | Disposition: A | Discharge status: Home / Self Care | Payer: PRIVATE HEALTH INSURANCE | Source: Ambulatory Visit | Attending: Gastroenterology | Admitting: Gastroenterology

## 2016-10-05 ENCOUNTER — Encounter
Admission: RE | Disposition: A | Discharge status: Home / Self Care | Payer: Self-pay | Source: Ambulatory Visit | Attending: Gastroenterology

## 2016-10-05 ENCOUNTER — Ambulatory Visit: Payer: PRIVATE HEALTH INSURANCE | Admitting: Anesthesiology

## 2016-10-05 DIAGNOSIS — Z79899 Other long term (current) drug therapy: Secondary | ICD-10-CM | POA: Insufficient documentation

## 2016-10-05 DIAGNOSIS — Z7984 Long term (current) use of oral hypoglycemic drugs: Secondary | ICD-10-CM | POA: Insufficient documentation

## 2016-10-05 DIAGNOSIS — Z9989 Dependence on other enabling machines and devices: Secondary | ICD-10-CM | POA: Insufficient documentation

## 2016-10-05 DIAGNOSIS — Z6832 Body mass index (BMI) 32.0-32.9, adult: Secondary | ICD-10-CM | POA: Insufficient documentation

## 2016-10-05 DIAGNOSIS — E785 Hyperlipidemia, unspecified: Secondary | ICD-10-CM | POA: Insufficient documentation

## 2016-10-05 DIAGNOSIS — G4733 Obstructive sleep apnea (adult) (pediatric): Secondary | ICD-10-CM | POA: Insufficient documentation

## 2016-10-05 DIAGNOSIS — E119 Type 2 diabetes mellitus without complications: Secondary | ICD-10-CM | POA: Insufficient documentation

## 2016-10-05 DIAGNOSIS — M109 Gout, unspecified: Secondary | ICD-10-CM | POA: Insufficient documentation

## 2016-10-05 DIAGNOSIS — Z8546 Personal history of malignant neoplasm of prostate: Secondary | ICD-10-CM | POA: Insufficient documentation

## 2016-10-05 DIAGNOSIS — Z884 Allergy status to anesthetic agent status: Secondary | ICD-10-CM | POA: Insufficient documentation

## 2016-10-05 DIAGNOSIS — D123 Benign neoplasm of transverse colon: Secondary | ICD-10-CM | POA: Insufficient documentation

## 2016-10-05 DIAGNOSIS — E669 Obesity, unspecified: Secondary | ICD-10-CM | POA: Insufficient documentation

## 2016-10-05 DIAGNOSIS — D122 Benign neoplasm of ascending colon: Secondary | ICD-10-CM | POA: Insufficient documentation

## 2016-10-05 DIAGNOSIS — I10 Essential (primary) hypertension: Secondary | ICD-10-CM | POA: Insufficient documentation

## 2016-10-05 DIAGNOSIS — Z7982 Long term (current) use of aspirin: Secondary | ICD-10-CM | POA: Insufficient documentation

## 2016-10-05 DIAGNOSIS — Z800 Family history of malignant neoplasm of digestive organs: Secondary | ICD-10-CM | POA: Insufficient documentation

## 2016-10-05 DIAGNOSIS — K5730 Diverticulosis of large intestine without perforation or abscess without bleeding: Secondary | ICD-10-CM | POA: Insufficient documentation

## 2016-10-05 DIAGNOSIS — D124 Benign neoplasm of descending colon: Secondary | ICD-10-CM | POA: Insufficient documentation

## 2016-10-05 DIAGNOSIS — Z1211 Encounter for screening for malignant neoplasm of colon: Secondary | ICD-10-CM | POA: Insufficient documentation

## 2016-10-05 DIAGNOSIS — Z87442 Personal history of urinary calculi: Secondary | ICD-10-CM | POA: Insufficient documentation

## 2016-10-05 DIAGNOSIS — K219 Gastro-esophageal reflux disease without esophagitis: Secondary | ICD-10-CM | POA: Insufficient documentation

## 2016-10-05 DIAGNOSIS — Q438 Other specified congenital malformations of intestine: Secondary | ICD-10-CM | POA: Insufficient documentation

## 2016-10-05 HISTORY — DX: Other complications of anesthesia, initial encounter: T88.59XA

## 2016-10-05 HISTORY — DX: Malignant (primary) neoplasm, unspecified: C80.1

## 2016-10-05 HISTORY — DX: Personal history of urinary calculi: Z87.442

## 2016-10-05 HISTORY — DX: Adverse effect of unspecified anesthetic, initial encounter: T41.45XA

## 2016-10-05 HISTORY — DX: Gout, unspecified: M10.9

## 2016-10-05 HISTORY — DX: Gastro-esophageal reflux disease without esophagitis: K21.9

## 2016-10-05 HISTORY — PX: COLONOSCOPY WITH PROPOFOL: SHX5780

## 2016-10-05 LAB — GLUCOSE, CAPILLARY: Glucose-Capillary: 195 mg/dL — ABNORMAL HIGH (ref 65–99)

## 2016-10-05 SURGERY — COLONOSCOPY WITH PROPOFOL
Anesthesia: General

## 2016-10-05 MED ORDER — ONDANSETRON HCL 4 MG/2ML IJ SOLN: 4.0000 mg | Freq: Once | INTRAMUSCULAR | Status: DC | PRN

## 2016-10-05 MED ORDER — EPHEDRINE SULFATE 50 MG/ML IJ SOLN
INTRAMUSCULAR | Status: DC | PRN
  Administered 2016-10-05 (×2): 10 mg via INTRAVENOUS

## 2016-10-05 MED ORDER — SODIUM CHLORIDE 0.9 % IV SOLN
INTRAVENOUS | Status: DC
  Administered 2016-10-05: 09:00:00 via INTRAVENOUS

## 2016-10-05 MED ORDER — FENTANYL CITRATE (PF) 100 MCG/2ML IJ SOLN
INTRAMUSCULAR | Status: AC
  Filled 2016-10-05: qty 2

## 2016-10-05 MED ORDER — LIDOCAINE HCL (CARDIAC) 20 MG/ML IV SOLN
INTRAVENOUS | Status: DC | PRN
  Administered 2016-10-05: 40 mg via INTRAVENOUS

## 2016-10-05 MED ORDER — FENTANYL CITRATE (PF) 100 MCG/2ML IJ SOLN: 25.0000 ug | INTRAMUSCULAR | Status: DC | PRN

## 2016-10-05 MED ORDER — PROPOFOL 500 MG/50ML IV EMUL
INTRAVENOUS | Status: DC | PRN
  Administered 2016-10-05: 180 ug/kg/min via INTRAVENOUS

## 2016-10-05 MED ORDER — MIDAZOLAM HCL 2 MG/2ML IJ SOLN
INTRAMUSCULAR | Status: AC
  Filled 2016-10-05: qty 2

## 2016-10-05 MED ORDER — SODIUM CHLORIDE 0.9 % IV SOLN: INTRAVENOUS | Status: DC

## 2016-10-05 MED ORDER — PROPOFOL 10 MG/ML IV BOLUS
INTRAVENOUS | Status: DC | PRN
  Administered 2016-10-05: 70 mg via INTRAVENOUS
  Administered 2016-10-05 (×3): 20 mg via INTRAVENOUS

## 2016-10-05 MED ORDER — PROPOFOL 500 MG/50ML IV EMUL
INTRAVENOUS | Status: AC
  Filled 2016-10-05: qty 50

## 2016-10-05 NOTE — Transfer of Care (Signed)
Immediate Anesthesia Transfer of Care Note  Patient: Scott Skinner  Procedure(s) Performed: Procedure(s): COLONOSCOPY WITH PROPOFOL (N/A)  Patient Location: PACU  Anesthesia Type:General  Level of Consciousness: awake  Airway & Oxygen Therapy: Patient Spontanous Breathing and Patient connected to nasal cannula oxygen  Post-op Assessment: Report given to RN and Post -op Vital signs reviewed and stable  Post vital signs: Reviewed and stable  Last Vitals:  Vitals:   10/05/16 0845 10/05/16 1120  BP: 126/85 126/76  Pulse: 73 68  Resp: 20 20  Temp: 36.6 C (!) 35.7 C    Last Pain:  Vitals:   10/05/16 1120  TempSrc: Tympanic         Complications: No apparent anesthesia complications

## 2016-10-05 NOTE — Anesthesia Procedure Notes (Signed)
Date/Time: 10/05/2016 10:25 AM Performed by: Allean Found Pre-anesthesia Checklist: Patient identified, Emergency Drugs available, Suction available, Patient being monitored and Timeout performed Patient Re-evaluated:Patient Re-evaluated prior to induction Oxygen Delivery Method: Nasal cannula Induction Type: IV induction Placement Confirmation: positive ETCO2

## 2016-10-05 NOTE — H&P (Signed)
Outpatient short stay form Pre-procedure 10/05/2016 10:25 AM Scott Sails MD  Primary Physician: Dr [name]  Reason for visit:  Colonoscopy  History of present illness:  Patient is a 66 year old male presenting today as above. There is family history of colon cancer in primary relatives, mother. He tolerated his prep well. He takes no blood thinning agents she does take an 81 mg aspirin that has been held for several days however no other aspirin products.    Current Facility-Administered Medications:  .  0.9 %  sodium chloride infusion, , Intravenous, Continuous, Scott Sails, MD, Last Rate: 20 mL/hr at 10/05/16 0916 .  0.9 %  sodium chloride infusion, , Intravenous, Continuous, Scott Sails, MD .  fentaNYL (SUBLIMAZE) injection 25 mcg, 25 mcg, Intravenous, Q5 min PRN, Alvin Critchley, MD .  ondansetron [facility]) injection 4 mg, 4 mg, Intravenous, Once PRN, Alvin Critchley, MD  Prescriptions Prior to Admission  Medication Sig Dispense Refill Last Dose  . aspirin EC 81 MG tablet Take 81 mg by mouth daily.   09/30/2016  . diclofenac (VOLTAREN) 75 MG EC tablet Take 1 tablet (75 mg total) by mouth 2 (two) times daily. 30 tablet 1 Past Month at Unknown time  . esomeprazole (NEXIUM) 20 MG capsule Take 20 mg by mouth daily at 12 noon.   Past Month at Unknown time  . fluticasone (FLONASE) 50 MCG/ACT nasal spray Place into both nostrils daily.   10/05/2016 at Unknown time  . Grape Seed Extract 100 MG CAPS Take by mouth.   Past Month at Unknown time  . indomethacin (INDOCIN) 25 MG capsule Take 25 mg by mouth 3 (three) times daily as needed.   Past Month at Unknown time  . lansoprazole (PREVACID) 15 MG capsule Take 15 mg by mouth daily as needed.   Past Month at Unknown time  . losartan (COZAAR) 25 MG tablet Take 1 tablet (25 mg total) by mouth daily. (Patient taking differently: Take 50 mg by mouth daily. ) 90 tablet 1 10/04/2016 at Unknown time  . metFORMIN (GLUCOPHAGE) 1000 MG  tablet Take 1 tablet (1,000 mg total) by mouth 2 (two) times daily. 180 tablet 1 10/04/2016 at Unknown time  . PARoxetine (PAXIL) 30 MG tablet Take 30 mg by mouth daily.   10/02/2016  . pravastatin (PRAVACHOL) 20 MG tablet Take 20 mg by mouth daily.   09/30/2016  . Turmeric 500 MG CAPS Take by mouth daily.   Past Week at Unknown time  . amLODipine (NORVASC) 5 MG tablet Take 1 tablet (5 mg total) by mouth daily. (Patient not taking: Reported on 10/05/2016) 90 tablet 0 Not Taking at Unknown time  . Calcium Carbonate-Vitamin D (CALCIUM-VITAMIN D) 500-200 MG-UNIT tablet Take 1 tablet by mouth daily.   Not Taking at Unknown time  . glimepiride (AMARYL) 2 MG tablet Take 1 tablet (2 mg total) by mouth daily before breakfast. 90 tablet 0 10/03/2016  . glucose blood (ACCU-CHEK ACTIVE STRIPS) test strip Use as instructed 100 each 12 Taking  . lisdexamfetamine (VYVANSE) 50 MG capsule Take 1 capsule (50 mg total) by mouth every morning. (Patient not taking: Reported on 10/05/2016) 30 capsule 0 Not Taking at Unknown time  . lisdexamfetamine (VYVANSE) 70 MG capsule Take 1 capsule (70 mg total) by mouth daily. Please fill on/after September 29, 2015 (Patient not taking: Reported on 10/05/2016) 30 capsule 0 Not Taking at Unknown time  . lisdexamfetamine (VYVANSE) 70 MG capsule Take 1 capsule (70 mg total) by mouth  every morning. (Patient not taking: Reported on 10/05/2016) 30 capsule 0 Not Taking at Unknown time  . loratadine (CLARITIN) 10 MG tablet Take 10 mg by mouth daily.   09/30/2016  . Omega-3 1000 MG CAPS Take by mouth.   09/30/2016  . Red Yeast Rice Extract 600 MG CAPS Take by mouth.   Not Taking at Unknown time     Allergies  Allergen Reactions  . Other Anaphylaxis    ANESTHESIA  ANESTHESIA      Past Medical History:  Diagnosis Date  . Cancer [facility])    prostate cancer   . Complication of anesthesia    anaphylaxis   . Diabetes mellitus without complication ([place])   . GERD (gastroesophageal reflux disease)    . Gout   . History of kidney stones   . Hyperlipidemia   . Hypertension   . Obesity   . OSA (obstructive sleep apnea)     Review of systems:      Physical Exam    Heart and lungs: Regular rate and rhythm without rub or gallop, lungs are bilaterally clear.    HEENT: Normocephalic atraumatic eyes are anicteric    Other:     Pertinant exam for procedure: Soft nontender nondistended bowel sounds positive normoactive.    Planned proceedures: Colonoscopy and indicated procedures. I have discussed the risks benefits and complications of procedures to include not limited to bleeding, infection, perforation and the risk of sedation and the patient wishes to proceed.    Scott Sails, MD Gastroenterology 10/05/2016  10:25 AM

## 2016-10-05 NOTE — Anesthesia Post-op Follow-up Note (Cosign Needed)
Anesthesia QCDR form completed.        

## 2016-10-05 NOTE — Anesthesia Preprocedure Evaluation (Addendum)
Anesthesia Evaluation  Patient identified by MRN, date of birth, ID bandGeneral Assessment Comment:Heart rate dropped during back/neck surgery  Reviewed: Allergy & Precautions, NPO status , Patient's Chart, lab work & pertinent test results  Airway Mallampati: III  TM Distance: <3 FB     Dental  (+) Chipped   Pulmonary sleep apnea and Continuous Positive Airway Pressure Ventilation ,    Pulmonary exam normal        Cardiovascular hypertension, Pt. on medications Normal cardiovascular exam     Neuro/Psych PSYCHIATRIC DISORDERS negative neurological ROS     GI/Hepatic Neg liver ROS, GERD  ,  Endo/Other  diabetes, Well Controlled, Type 2, Oral Hypoglycemic Agents  Renal/GU negative Renal ROS  negative genitourinary   Musculoskeletal   Abdominal Normal abdominal exam  (+)   Peds negative pediatric ROS (+)  Hematology   Anesthesia Other Findings Past Medical History: No date: Cancer ([place])     Comment:  prostate cancer  No date: Complication of anesthesia     Comment:  anaphylaxis  No date: Diabetes mellitus without complication ([facility]) No date: GERD (gastroesophageal reflux disease) No date: Gout No date: History of kidney stones No date: Hyperlipidemia No date: Hypertension No date: Obesity No date: OSA (obstructive sleep apnea)  Reproductive/Obstetrics                            Anesthesia Physical Anesthesia Plan  ASA: III  Anesthesia Plan: General   Post-op Pain Management:    Induction: Intravenous  PONV Risk Score and Plan:   Airway Management Planned: Nasal Cannula  Additional Equipment:   Intra-op Plan:   Post-operative Plan:   Informed Consent: I have reviewed the patients History and Physical, chart, labs and discussed the procedure including the risks, benefits and alternatives for the proposed anesthesia with the patient or authorized representative who has indicated  his/her understanding and acceptance.   Dental advisory given  Plan Discussed with: CRNA  Anesthesia Plan Comments:         Anesthesia Quick Evaluation

## 2016-10-05 NOTE — Op Note (Signed)
[facility] Gastroenterology Patient Name: Scott Skinner Procedure Date: 10/05/2016 10:23 AM MRN: [id number] Account #: [id number] Date of Birth: 1950-09-18 Admit Type: Outpatient Age: 66 Room: [facility] ENDO ROOM 1 Gender: Male Note Status: Finalized Procedure:            Colonoscopy Indications:          Screening in patient at increased risk: Family history                        of 1st-degree relative with colorectal cancer Providers:            [name], MD Referring MD:         [name] (Referring MD) Medicines:            Monitored Anesthesia Care Complications:        No immediate complications. Procedure:            Pre-Anesthesia Assessment:                       - ASA Grade Assessment: III - A patient with severe                        systemic disease.                       After obtaining informed consent, the colonoscope was                        passed under direct vision. Throughout the procedure,                        the patient's blood pressure, pulse, and oxygen                        saturations were monitored continuously. The                        Colonoscope was introduced through the anus and                        advanced to the the cecum, identified by appendiceal                        orifice and ileocecal valve. The colonoscopy was                        unusually difficult due to poor bowel prep, significant                        looping and a tortuous colon. Successful completion of                        the procedure was aided by changing the patient to a                        supine position, changing the patient to a prone                        position, using manual pressure and lavage. The patient  tolerated the procedure well. The quality of the bowel                        preparation was fair. Findings:      A 15 mm polyp was found in the proximal descending colon. The polyp was        sessile. The polyp was removed with a hot snare. Resection and retrieval       were complete.      A 2 mm polyp was found in the ascending colon. The polyp was sessile.       The polyp was removed with a cold biopsy forceps. Resection and       retrieval were complete.      A 2 mm polyp was found in the transverse colon. The polyp was sessile.       The polyp was removed with a cold biopsy forceps. Resection and       retrieval were complete.      A few small-mouthed diverticula were found in the sigmoid colon.      The retroflexed view of the distal rectum and anal verge was normal and       showed no anal or rectal abnormalities.      The digital rectal exam was normal. Impression:           - Preparation of the colon was fair.                       - One 15 mm polyp in the proximal descending colon,                        removed with a hot snare. Resected and retrieved.                       - One 2 mm polyp in the ascending colon, removed with a                        cold biopsy forceps. Resected and retrieved.                       - One 2 mm polyp in the transverse colon, removed with                        a cold biopsy forceps. Resected and retrieved.                       - Diverticulosis in the sigmoid colon.                       - The distal rectum and anal verge are normal on                        retroflexion view. Recommendation:       - Discharge patient to home.                       - Await pathology results.                       - Telephone GI clinic for pathology results in 1 week. Procedure Code(s):    --- Professional ---  45385, Colonoscopy, flexible; with removal of tumor(s),                        polyp(s), or other lesion(s) by snare technique                       45380, 59, Colonoscopy, flexible; with biopsy, single                        or multiple Diagnosis Code(s):    --- Professional ---                       Z80.0,  Family history of malignant neoplasm of                        digestive organs                       D12.4, Benign neoplasm of descending colon                       D12.2, Benign neoplasm of ascending colon                       D12.3, Benign neoplasm of transverse colon (hepatic                        flexure or splenic flexure)                       K57.30, Diverticulosis of large intestine without                        perforation or abscess without bleeding CPT copyright 2016 American Medical Association. All rights reserved. The codes documented in this report are preliminary and upon coder review may  be revised to meet current compliance requirements. [name], MD 10/05/2016 11:19:30 AM This report has been signed electronically. Number of Addenda: 0 Note Initiated On: 10/05/2016 10:23 AM Scope Withdrawal Time: 0 hours 18 minutes 44 seconds  Total Procedure Duration: 0 hours 43 minutes 29 seconds       [facility]

## 2016-10-05 NOTE — Anesthesia Postprocedure Evaluation (Signed)
Anesthesia Post Note  Patient: Scott Skinner  Procedure(s) Performed: Procedure(s) (LRB): COLONOSCOPY WITH PROPOFOL (N/A)  Patient location during evaluation: PACU Anesthesia Type: General Level of consciousness: awake and alert and oriented Pain management: pain level controlled Vital Signs Assessment: post-procedure vital signs reviewed and stable Respiratory status: spontaneous breathing Cardiovascular status: blood pressure returned to baseline Anesthetic complications: no     Last Vitals:  Vitals:   10/05/16 1130 10/05/16 1145  BP: 140/90 138/77  Pulse: 62 (!) 57  Resp: 15 13  Temp:      Last Pain:  Vitals:   10/05/16 1120  TempSrc: Tympanic                 Gil Ingwersen

## 2016-10-06 ENCOUNTER — Encounter: Payer: Self-pay | Admitting: Gastroenterology

## 2016-10-06 LAB — SURGICAL PATHOLOGY

## 2017-11-30 DIAGNOSIS — Z9989 Dependence on other enabling machines and devices: Secondary | ICD-10-CM | POA: Insufficient documentation

## 2017-11-30 DIAGNOSIS — G4733 Obstructive sleep apnea (adult) (pediatric): Secondary | ICD-10-CM | POA: Insufficient documentation

## 2018-07-26 DIAGNOSIS — E66811 Obesity, class 1: Secondary | ICD-10-CM | POA: Insufficient documentation

## 2018-07-26 DIAGNOSIS — E6609 Other obesity due to excess calories: Secondary | ICD-10-CM | POA: Insufficient documentation

## 2018-11-28 DIAGNOSIS — Z0000 Encounter for general adult medical examination without abnormal findings: Secondary | ICD-10-CM | POA: Insufficient documentation

## 2019-01-30 ENCOUNTER — Encounter: Payer: Medicare Other | Attending: Family Medicine | Admitting: *Deleted

## 2019-01-30 ENCOUNTER — Other Ambulatory Visit: Payer: Self-pay

## 2019-01-30 ENCOUNTER — Encounter: Payer: Self-pay | Admitting: *Deleted

## 2019-01-30 VITALS — BP 138/90 | Ht 74.0 in | Wt 259.1 lb

## 2019-01-30 DIAGNOSIS — E1169 Type 2 diabetes mellitus with other specified complication: Secondary | ICD-10-CM | POA: Diagnosis not present

## 2019-01-30 DIAGNOSIS — E785 Hyperlipidemia, unspecified: Secondary | ICD-10-CM | POA: Insufficient documentation

## 2019-01-30 DIAGNOSIS — Z713 Dietary counseling and surveillance: Secondary | ICD-10-CM | POA: Insufficient documentation

## 2019-01-30 DIAGNOSIS — E119 Type 2 diabetes mellitus without complications: Secondary | ICD-10-CM

## 2019-01-30 NOTE — Progress Notes (Signed)
Diabetes Self-Management Education  Visit Type: First/Initial  Appt. Start Time: 1045 Appt. End Time: 1215  01/30/2019  Mr. Scott Skinner, identified by name and date of birth, is a 68 y.o. male with a diagnosis of Diabetes: Type 2.   ASSESSMENT  Blood pressure 138/90, height 6\' 2"  (1.88 m), weight 259 lb 1.6 oz (117.5 kg). Body mass index is 33.27 kg/m.  Diabetes Self-Management Education - 01/30/19 1246      Visit Information   Visit Type  First/Initial      Initial Visit   Diabetes Type  Type 2    Are you currently following a meal plan?  No    Are you taking your medications as prescribed?  Yes    Date Diagnosed  August 2011      Health Coping   How would you rate your overall health?  Fair      Psychosocial Assessment   Patient Belief/Attitude about Diabetes  Motivated to manage diabetes    Self-care barriers  None    Self-management support  Doctor's office;Family    Patient Concerns  Nutrition/Meal planning;Glycemic Control;Medication;Monitoring;Healthy Lifestyle;Weight Control    Special Needs  None    Preferred Learning Style  Auditory;Visual    Learning Readiness  Ready    How often do you need to have someone help you when you read instructions, pamphlets, or other written materials from your doctor or pharmacy?  1 - Never    What is the last grade level you completed in school?  Masters      Pre-Education Assessment   Patient understands the diabetes disease and treatment process.  Needs Instruction    Patient understands incorporating nutritional management into lifestyle.  Needs Instruction    Patient undertands incorporating physical activity into lifestyle.  Needs Review    Patient understands using medications safely.  Needs Instruction    Patient understands monitoring blood glucose, interpreting and using results  Needs Review    Patient understands prevention, detection, and treatment of acute complications.  Needs Instruction    Patient  understands prevention, detection, and treatment of chronic complications.  Needs Instruction    Patient understands how to develop strategies to address psychosocial issues.  Needs Instruction    Patient understands how to develop strategies to promote health/change behavior.  Needs Instruction      Complications   Last HgB A1C per patient/outside source  7.3 %   11/22/2018   How often do you check your blood sugar?  3-4 times / week    Fasting Blood glucose range (mg/dL)  130-179;180-200   Pt reports FBG's 160-210 mg/dL   Have you had a dilated eye exam in the past 12 months?  Yes    Have you had a dental exam in the past 12 months?  Yes    Are you checking your feet?  No      Dietary Intake   Breakfast  eggs and bacon 1-2 x week; granola bar; protein shake with fruit    Snack (morning)  [place] yogurt, celery and carrots with dip    Lunch  Healthy Choice dinner with chicken, beef, pork; chicken salad from [employer]; tacos; hamburger    Snack (afternoon)  pickle, celery and cuccumbers with dip, boiled egg    Dinner  chicken, beef, pork, [place] sausage, fish with sweet potatoes, green beans, occasional corn, broccoli, cauliflower, spinach, salads, peppers, tomatoes    Snack (evening)  occasional fruit  - orange    Beverage(s)  water,  occasional coffee      Exercise   Exercise Type  Light (walking / raking leaves)    How many days per week to you exercise?  3    How many minutes per day do you exercise?  20    Total minutes per week of exercise  60      Patient Education   Previous Diabetes Education  No    Disease state   Definition of diabetes, type 1 and 2, and the diagnosis of diabetes;Factors that contribute to the development of diabetes;Explored patient's options for treatment of their diabetes    Nutrition management   Role of diet in the treatment of diabetes and the relationship between the three main macronutrients and blood glucose level;Food label reading, portion sizes and  measuring food.;Carbohydrate counting;Reviewed blood glucose goals for pre and post meals and how to evaluate the patients' food intake on their blood glucose level.    Physical activity and exercise   Role of exercise on diabetes management, blood pressure control and cardiac health.    Medications  Reviewed patients medication for diabetes, action, purpose, timing of dose and side effects.    Monitoring  Purpose and frequency of SMBG.;Taught/discussed recording of test results and interpretation of SMBG.;Identified appropriate SMBG and/or A1C goals.    Chronic complications  Relationship between chronic complications and blood glucose control    Psychosocial adjustment  Identified and addressed patients feelings and concerns about diabetes      Individualized Goals (developed by patient)   Reducing Risk  Improve blood sugars Decrease medications Prevent diabetes complications Lose weight Lead a healthier lifestyle     Outcomes   Expected Outcomes  Demonstrated interest in learning. Expect positive outcomes    Future DMSE  PRN    Program Status  Not Completed       Individualized Plan for Diabetes Self-Management Training:   Learning Objective:  Patient will have a greater understanding of diabetes self-management. Patient education plan is to attend individual and/or group sessions per assessed needs and concerns.   Plan:   Patient Instructions  Check blood sugars 1-2 x day before breakfast and 2 hrs after one meal 3-4 x week Exercise: Continue power walking  for 15-30  minutes  3-4 days a week and gradually increase to 150 minutes/week Eat 3 meals day, 2 snacks a day Space meals 4-6 hours apart Limit desserts/sweets Call back if you want to schedule an appointment with the nurse or dietitian  Expected Outcomes:  Demonstrated interest in learning. Expect positive outcomes  Education material provided:  General Meal Planning Guidelines Simple Meal Plan  If problems or  questions, patient to contact team via:  Scott Drilling, RN, CCM, CDE [phone]  Future DSME appointment: PRN  He will call back if he wants to schedule an appointment with this nurse or a dietitian.

## 2019-01-30 NOTE — Patient Instructions (Addendum)
Check blood sugars 1-2 x day before breakfast and 2 hrs after one meal 3-4 x week  Exercise: Continue power walking  for 15-30  minutes  3-4 days a week and gradually increase to 150 minutes/week  Eat 3 meals day, 2 snacks a day Space meals 4-6 hours apart Limit desserts/sweets  Call back if you want to schedule an appointment with the nurse or dietitian

## 2019-05-10 DIAGNOSIS — I209 Angina pectoris, unspecified: Secondary | ICD-10-CM | POA: Diagnosis present

## 2019-05-15 ENCOUNTER — Other Ambulatory Visit: Payer: Self-pay

## 2019-05-15 ENCOUNTER — Other Ambulatory Visit
Admission: RE | Admit: 2019-05-15 | Discharge: 2019-05-15 | Disposition: A | Discharge status: Home / Self Care | Payer: Medicare Other | Source: Ambulatory Visit | Attending: Internal Medicine | Admitting: Internal Medicine

## 2019-05-15 DIAGNOSIS — Z20822 Contact with and (suspected) exposure to covid-19: Secondary | ICD-10-CM | POA: Diagnosis not present

## 2019-05-15 DIAGNOSIS — Z01812 Encounter for preprocedural laboratory examination: Secondary | ICD-10-CM | POA: Insufficient documentation

## 2019-05-16 LAB — SARS CORONAVIRUS 2 (TAT 6-24 HRS): SARS Coronavirus 2: NEGATIVE

## 2019-05-17 ENCOUNTER — Ambulatory Visit
Admission: RE | Admit: 2019-05-17 | Discharge: 2019-05-17 | Disposition: A | Discharge status: Home / Self Care | Payer: Medicare Other | Attending: Internal Medicine | Admitting: Internal Medicine

## 2019-05-17 ENCOUNTER — Other Ambulatory Visit: Payer: Self-pay

## 2019-05-17 ENCOUNTER — Encounter
Admission: RE | Disposition: A | Discharge status: Home / Self Care | Payer: Self-pay | Source: Home / Self Care | Attending: Internal Medicine

## 2019-05-17 ENCOUNTER — Encounter: Payer: Self-pay | Admitting: Internal Medicine

## 2019-05-17 DIAGNOSIS — I10 Essential (primary) hypertension: Secondary | ICD-10-CM | POA: Diagnosis not present

## 2019-05-17 DIAGNOSIS — R9430 Abnormal result of cardiovascular function study, unspecified: Secondary | ICD-10-CM

## 2019-05-17 DIAGNOSIS — I208 Other forms of angina pectoris: Secondary | ICD-10-CM | POA: Insufficient documentation

## 2019-05-17 DIAGNOSIS — I209 Angina pectoris, unspecified: Secondary | ICD-10-CM | POA: Diagnosis present

## 2019-05-17 DIAGNOSIS — E119 Type 2 diabetes mellitus without complications: Secondary | ICD-10-CM | POA: Insufficient documentation

## 2019-05-17 DIAGNOSIS — M109 Gout, unspecified: Secondary | ICD-10-CM | POA: Insufficient documentation

## 2019-05-17 DIAGNOSIS — Z794 Long term (current) use of insulin: Secondary | ICD-10-CM | POA: Diagnosis not present

## 2019-05-17 DIAGNOSIS — Z79899 Other long term (current) drug therapy: Secondary | ICD-10-CM | POA: Diagnosis not present

## 2019-05-17 DIAGNOSIS — R0602 Shortness of breath: Secondary | ICD-10-CM | POA: Insufficient documentation

## 2019-05-17 DIAGNOSIS — K219 Gastro-esophageal reflux disease without esophagitis: Secondary | ICD-10-CM | POA: Diagnosis not present

## 2019-05-17 DIAGNOSIS — Z7982 Long term (current) use of aspirin: Secondary | ICD-10-CM | POA: Diagnosis not present

## 2019-05-17 HISTORY — DX: Malignant neoplasm of prostate: C61

## 2019-05-17 HISTORY — PX: LEFT HEART CATH AND CORONARY ANGIOGRAPHY: CATH118249

## 2019-05-17 LAB — GLUCOSE, CAPILLARY
Glucose-Capillary: 113 mg/dL — ABNORMAL HIGH (ref 70–99)
Glucose-Capillary: 145 mg/dL — ABNORMAL HIGH (ref 70–99)

## 2019-05-17 SURGERY — LEFT HEART CATH AND CORONARY ANGIOGRAPHY
Anesthesia: Moderate Sedation | Laterality: Left

## 2019-05-17 MED ORDER — HEPARIN (PORCINE) IN NACL 1000-0.9 UT/500ML-% IV SOLN
INTRAVENOUS | Status: AC
  Filled 2019-05-17: qty 1000

## 2019-05-17 MED ORDER — FENTANYL CITRATE (PF) 100 MCG/2ML IJ SOLN
INTRAMUSCULAR | Status: AC
  Filled 2019-05-17: qty 2

## 2019-05-17 MED ORDER — ACETAMINOPHEN 325 MG PO TABS: 650.0000 mg | ORAL_TABLET | ORAL | Status: DC | PRN

## 2019-05-17 MED ORDER — VERAPAMIL HCL 2.5 MG/ML IV SOLN
INTRAVENOUS | Status: DC | PRN
  Administered 2019-05-17: 2.5 mg via INTRA_ARTERIAL

## 2019-05-17 MED ORDER — SODIUM CHLORIDE 0.9% FLUSH: 3.0000 mL | INTRAVENOUS | Status: DC | PRN

## 2019-05-17 MED ORDER — HEPARIN SODIUM (PORCINE) 1000 UNIT/ML IJ SOLN
INTRAMUSCULAR | Status: AC
  Filled 2019-05-17: qty 1

## 2019-05-17 MED ORDER — MIDAZOLAM HCL 2 MG/2ML IJ SOLN
INTRAMUSCULAR | Status: AC
  Filled 2019-05-17: qty 2

## 2019-05-17 MED ORDER — HEPARIN SODIUM (PORCINE) 1000 UNIT/ML IJ SOLN
INTRAMUSCULAR | Status: DC | PRN
  Administered 2019-05-17: 5000 [IU] via INTRAVENOUS

## 2019-05-17 MED ORDER — ASPIRIN 81 MG PO CHEW
CHEWABLE_TABLET | ORAL | Status: AC
  Filled 2019-05-17: qty 1

## 2019-05-17 MED ORDER — HYDRALAZINE HCL 20 MG/ML IJ SOLN: 10.0000 mg | INTRAMUSCULAR | Status: DC | PRN

## 2019-05-17 MED ORDER — HEPARIN (PORCINE) IN NACL 1000-0.9 UT/500ML-% IV SOLN
INTRAVENOUS | Status: DC | PRN
  Administered 2019-05-17: 500 mL

## 2019-05-17 MED ORDER — FENTANYL CITRATE (PF) 100 MCG/2ML IJ SOLN
INTRAMUSCULAR | Status: DC | PRN
  Administered 2019-05-17: 50 ug via INTRAVENOUS
  Administered 2019-05-17: 25 ug via INTRAVENOUS

## 2019-05-17 MED ORDER — IOHEXOL 300 MG/ML  SOLN
INTRAMUSCULAR | Status: DC | PRN
  Administered 2019-05-17: 125 mL

## 2019-05-17 MED ORDER — ONDANSETRON HCL 4 MG/2ML IJ SOLN: 4.0000 mg | Freq: Four times a day (QID) | INTRAMUSCULAR | Status: DC | PRN

## 2019-05-17 MED ORDER — LABETALOL HCL 5 MG/ML IV SOLN: 10.0000 mg | INTRAVENOUS | Status: DC | PRN

## 2019-05-17 MED ORDER — SODIUM CHLORIDE 0.9 % IV SOLN: 250.0000 mL | INTRAVENOUS | Status: DC | PRN

## 2019-05-17 MED ORDER — SODIUM CHLORIDE 0.9 % WEIGHT BASED INFUSION: 1.0000 mL/kg/h | INTRAVENOUS | Status: DC

## 2019-05-17 MED ORDER — SODIUM CHLORIDE 0.9% FLUSH: 3.0000 mL | Freq: Two times a day (BID) | INTRAVENOUS | Status: DC

## 2019-05-17 MED ORDER — SODIUM CHLORIDE 0.9 % WEIGHT BASED INFUSION
3.0000 mL/kg/h | INTRAVENOUS | Status: AC
  Administered 2019-05-17: 3 mL/kg/h via INTRAVENOUS

## 2019-05-17 MED ORDER — MIDAZOLAM HCL 2 MG/2ML IJ SOLN
INTRAMUSCULAR | Status: DC | PRN
  Administered 2019-05-17: 0.5 mg via INTRAVENOUS
  Administered 2019-05-17 (×2): 1 mg via INTRAVENOUS

## 2019-05-17 MED ORDER — ASPIRIN 81 MG PO CHEW
81.0000 mg | CHEWABLE_TABLET | ORAL | Status: AC
  Administered 2019-05-17: 81 mg via ORAL

## 2019-05-17 MED ORDER — VERAPAMIL HCL 2.5 MG/ML IV SOLN
INTRAVENOUS | Status: AC
  Filled 2019-05-17: qty 2

## 2019-05-17 SURGICAL SUPPLY — 9 items
CATH 5F 110X4 TIG (CATHETERS) ×3 IMPLANT
CATH INFINITI 5 FR JL3.5 (CATHETERS) ×3 IMPLANT
CATH INFINITI 5FR ANG PIGTAIL (CATHETERS) ×3 IMPLANT
CATH INFINITI JR4 5F (CATHETERS) ×3 IMPLANT
DEVICE RAD TR BAND REGULAR (VASCULAR PRODUCTS) ×3 IMPLANT
GLIDESHEATH SLEND SS 6F .021 (SHEATH) ×3 IMPLANT
KIT MANI 3VAL PERCEP (MISCELLANEOUS) ×3 IMPLANT
PACK CARDIAC CATH (CUSTOM PROCEDURE TRAY) ×3 IMPLANT
WIRE ROSEN-J .035X260CM (WIRE) ×3 IMPLANT

## 2019-05-17 NOTE — Discharge Instructions (Signed)
Angiogram, Care After This sheet gives you information about how to care for yourself after your procedure. Your doctor may also give you more specific instructions. If you have problems or questions, contact your doctor. Follow these instructions at home: Insertion site care  Follow instructions from your doctor about how to take care of your long, thin tube (catheter) insertion area. Make sure you: ? Wash your hands with soap and water before you change your bandage (dressing). If you cannot use soap and water, use hand sanitizer. ? Change your bandage as told by your doctor. ? Leave stitches (sutures), skin glue, or skin tape (adhesive) strips in place. They may need to stay in place for 2 weeks or longer. If tape strips get loose and curl up, you may trim the loose edges. Do not remove tape strips completely unless your doctor says it is okay.  Do not take baths, swim, or use a hot tub until your doctor says it is okay.  You may shower 24-48 hours after the procedure or as told by your doctor. ? Gently wash the area with plain soap and water. ? Pat the area dry with a clean towel. ? Do not rub the area. This may cause bleeding.  Do not apply powder or lotion to the area. Keep the area clean and dry.  Check your insertion area every day for signs of infection. Check for: ? More redness, swelling, or pain. ? Fluid or blood. ? Warmth. ? Pus or a bad smell. Activity  Rest as told by your doctor, usually for 1-2 days.  Do not lift anything that is heavier than 10 lbs. (4.5 kg) or as told by your doctor.  Do not drive for 24 hours if you were given a medicine to help you relax (sedative).  Do not drive or use heavy machinery while taking prescription pain medicine. General instructions   Go back to your normal activities as told by your doctor, usually in about a week. Ask your doctor what activities are safe for you.  If the insertion area starts to bleed, lie flat and put  pressure on the area. If the bleeding does not stop, get help right away. This is an emergency.  Drink enough fluid to keep your pee (urine) clear or pale yellow.  Take over-the-counter and prescription medicines only as told by your doctor.  Keep all follow-up visits as told by your doctor. This is important. Contact a doctor if:  You have a fever.  You have chills.  You have more redness, swelling, or pain around your insertion area.  You have fluid or blood coming from your insertion area.  The insertion area feels warm to the touch.  You have pus or a bad smell coming from your insertion area.  You have more bruising around the insertion area.  Blood collects in the tissue around the insertion area (hematoma) that may be painful to the touch. Get help right away if:  You have a lot of pain in the insertion area.  The insertion area swells very fast.  The insertion area is bleeding, and the bleeding does not stop after holding steady pressure on the area.  The area near or just beyond the insertion area becomes pale, cool, tingly, or numb. These symptoms may be an emergency. Do not wait to see if the symptoms will go away. Get medical help right away. Call your local emergency services (911 in the [place] Do not drive yourself to the hospital.   Summary  After the procedure, it is common to have bruising and tenderness at the long, thin tube insertion area.  After the procedure, it is important to rest and drink plenty of fluids.  Do not take baths, swim, or use a hot tub until your doctor says it is okay to do so. You may shower 24-48 hours after the procedure or as told by your doctor.  If the insertion area starts to bleed, lie flat and put pressure on the area. If the bleeding does not stop, get help right away. This is an emergency. This information is not intended to replace advice given to you by your health care provider. Make sure you discuss any questions you have  with your health care provider. Document Revised: 02/12/2017 Document Reviewed: 02/25/2016 Elsevier Patient Education  2020 [place]  This sheet gives you information about how to care for yourself after your procedure. Your health care provider may also give you more specific instructions. If you have problems or questions, contact your health care provider. What can I expect after the procedure? After the procedure, it is common to have:  Bruising and tenderness at the catheter insertion area. Follow these instructions at home: Medicines  Take over-the-counter and prescription medicines only as told by your health care provider. Insertion site care  Follow instructions from your health care provider about how to take care of your insertion site. Make sure you: ? Wash your hands with soap and water before you change your bandage (dressing). If soap and water are not available, use hand sanitizer. ? Change your dressing as told by your health care provider. ? Leave stitches (sutures), skin glue, or adhesive strips in place. These skin closures may need to stay in place for 2 weeks or longer. If adhesive strip edges start to loosen and curl up, you may trim the loose edges. Do not remove adhesive strips completely unless your health care provider tells you to do that.  Check your insertion site every day for signs of infection. Check for: ? Redness, swelling, or pain. ? Fluid or blood. ? Pus or a bad smell. ? Warmth.  Do not take baths, swim, or use a hot tub until your health care provider approves.  You may shower 24-48 hours after the procedure, or as directed by your health care provider. ? Remove the dressing and gently wash the site with plain soap and water. ? Pat the area dry with a clean towel. ? Do not rub the site. That could cause bleeding.  Do not apply powder or lotion to the site. Activity   For 24 hours after the procedure, or as  directed by your health care provider: ? Do not flex or bend the affected arm. ? Do not push or pull heavy objects with the affected arm. ? Do not drive yourself home from the hospital or clinic. You may drive 24 hours after the procedure unless your health care provider tells you not to. ? Do not operate machinery or power tools.  Do not lift anything that is heavier than 10 lb (4.5 kg), or the limit that you are told, until your health care provider says that it is safe.  Ask your health care provider when it is okay to: ? Return to work or school. ? Resume usual physical activities or sports. ? Resume sexual activity. General instructions  If the catheter site starts to bleed, raise your arm and put firm pressure  on the site. If the bleeding does not stop, get help right away. This is a medical emergency.  If you went home on the same day as your procedure, a responsible adult should be with you for the first 24 hours after you arrive home.  Keep all follow-up visits as told by your health care provider. This is important. Contact a health care provider if:  You have a fever.  You have redness, swelling, or yellow drainage around your insertion site. Get help right away if:  You have unusual pain at the radial site.  The catheter insertion area swells very fast.  The insertion area is bleeding, and the bleeding does not stop when you hold steady pressure on the area.  Your arm or hand becomes pale, cool, tingly, or numb. These symptoms may represent a serious problem that is an emergency. Do not wait to see if the symptoms will go away. Get medical help right away. Call your local emergency services (911 in the [place] Do not drive yourself to the hospital. Summary  After the procedure, it is common to have bruising and tenderness at the site.  Follow instructions from your health care provider about how to take care of your radial site wound. Check the wound every day for  signs of infection.  Do not lift anything that is heavier than 10 lb (4.5 kg), or the limit that you are told, until your health care provider says that it is safe. This information is not intended to replace advice given to you by your health care provider. Make sure you discuss any questions you have with your health care provider. Document Revised: 04/07/2017 Document Reviewed: 04/07/2017 Elsevier Patient Education  [place].   Moderate Conscious Sedation, Adult, Care After These instructions provide you with information about caring for yourself after your procedure. Your health care provider may also give you more specific instructions. Your treatment has been planned according to current medical practices, but problems sometimes occur. Call your health care provider if you have any problems or questions after your procedure. What can I expect after the procedure? After your procedure, it is common:  To feel sleepy for several hours.  To feel clumsy and have poor balance for several hours.  To have poor judgment for several hours.  To vomit if you eat too soon. Follow these instructions at home: For at least 24 hours after the procedure:   Do not: ? Participate in activities where you could fall or become injured. ? Drive. ? Use heavy machinery. ? Drink alcohol. ? Take sleeping pills or medicines that cause drowsiness. ? Make important decisions or sign legal documents. ? Take care of children on your own.  Rest. Eating and drinking  Follow the diet recommended by your health care provider.  If you vomit: ? Drink water, juice, or soup when you can drink without vomiting. ? Make sure you have little or no nausea before eating solid foods. General instructions  Have a responsible adult stay with you until you are awake and alert.  Take over-the-counter and prescription medicines only as told by your health care provider.  If you smoke, do not smoke without  supervision.  Keep all follow-up visits as told by your health care provider. This is important. Contact a health care provider if:  You keep feeling nauseous or you keep vomiting.  You feel light-headed.  You develop a rash.  You have a fever. Get help right away if:  You have trouble breathing. This information is not intended to replace advice given to you by your health care provider. Make sure you discuss any questions you have with your health care provider. Document Revised: 02/12/2017 Document Reviewed: 06/22/2015 Elsevier Patient Education  2020 [employer].

## 2019-06-01 DIAGNOSIS — I2589 Other forms of chronic ischemic heart disease: Secondary | ICD-10-CM | POA: Insufficient documentation

## 2019-06-05 DIAGNOSIS — N1831 Chronic kidney disease, stage 3a: Secondary | ICD-10-CM | POA: Insufficient documentation

## 2019-06-05 DIAGNOSIS — N183 Chronic kidney disease, stage 3 unspecified: Secondary | ICD-10-CM | POA: Insufficient documentation

## 2019-06-12 ENCOUNTER — Other Ambulatory Visit: Payer: Self-pay | Admitting: Family Medicine

## 2019-06-12 DIAGNOSIS — R1310 Dysphagia, unspecified: Secondary | ICD-10-CM

## 2019-06-12 DIAGNOSIS — K219 Gastro-esophageal reflux disease without esophagitis: Secondary | ICD-10-CM

## 2019-06-20 ENCOUNTER — Ambulatory Visit
Admission: RE | Admit: 2019-06-20 | Discharge: 2019-06-20 | Disposition: A | Discharge status: Home / Self Care | Payer: Medicare Other | Source: Ambulatory Visit | Attending: Family Medicine | Admitting: Family Medicine

## 2019-06-20 ENCOUNTER — Other Ambulatory Visit: Payer: Self-pay

## 2019-06-20 DIAGNOSIS — R1310 Dysphagia, unspecified: Secondary | ICD-10-CM | POA: Insufficient documentation

## 2019-06-20 DIAGNOSIS — K219 Gastro-esophageal reflux disease without esophagitis: Secondary | ICD-10-CM | POA: Diagnosis present

## 2019-10-02 ENCOUNTER — Other Ambulatory Visit
Admission: RE | Admit: 2019-10-02 | Discharge: 2019-10-02 | Disposition: A | Discharge status: Home / Self Care | Payer: Medicare Other | Source: Ambulatory Visit | Attending: Internal Medicine | Admitting: Internal Medicine

## 2019-10-02 ENCOUNTER — Other Ambulatory Visit: Payer: Self-pay

## 2019-10-02 DIAGNOSIS — Z01812 Encounter for preprocedural laboratory examination: Secondary | ICD-10-CM | POA: Diagnosis present

## 2019-10-02 DIAGNOSIS — Z20822 Contact with and (suspected) exposure to covid-19: Secondary | ICD-10-CM | POA: Insufficient documentation

## 2019-10-02 LAB — SARS CORONAVIRUS 2 (TAT 6-24 HRS): SARS Coronavirus 2: NEGATIVE

## 2019-10-03 ENCOUNTER — Encounter: Payer: Self-pay | Admitting: Internal Medicine

## 2019-10-04 ENCOUNTER — Other Ambulatory Visit: Payer: Self-pay

## 2019-10-04 ENCOUNTER — Encounter: Payer: Self-pay | Admitting: Internal Medicine

## 2019-10-04 ENCOUNTER — Ambulatory Visit: Payer: Medicare Other | Admitting: Certified Registered"

## 2019-10-04 ENCOUNTER — Ambulatory Visit
Admission: RE | Admit: 2019-10-04 | Discharge: 2019-10-04 | Disposition: A | Discharge status: Home / Self Care | Payer: Medicare Other | Attending: Internal Medicine | Admitting: Internal Medicine

## 2019-10-04 ENCOUNTER — Encounter
Admission: RE | Disposition: A | Discharge status: Home / Self Care | Payer: Self-pay | Source: Home / Self Care | Attending: Internal Medicine

## 2019-10-04 DIAGNOSIS — Z7984 Long term (current) use of oral hypoglycemic drugs: Secondary | ICD-10-CM | POA: Insufficient documentation

## 2019-10-04 DIAGNOSIS — E785 Hyperlipidemia, unspecified: Secondary | ICD-10-CM | POA: Diagnosis not present

## 2019-10-04 DIAGNOSIS — K222 Esophageal obstruction: Secondary | ICD-10-CM | POA: Insufficient documentation

## 2019-10-04 DIAGNOSIS — I129 Hypertensive chronic kidney disease with stage 1 through stage 4 chronic kidney disease, or unspecified chronic kidney disease: Secondary | ICD-10-CM | POA: Diagnosis not present

## 2019-10-04 DIAGNOSIS — K640 First degree hemorrhoids: Secondary | ICD-10-CM | POA: Insufficient documentation

## 2019-10-04 DIAGNOSIS — Z79899 Other long term (current) drug therapy: Secondary | ICD-10-CM | POA: Diagnosis not present

## 2019-10-04 DIAGNOSIS — Z6831 Body mass index (BMI) 31.0-31.9, adult: Secondary | ICD-10-CM | POA: Insufficient documentation

## 2019-10-04 DIAGNOSIS — G4733 Obstructive sleep apnea (adult) (pediatric): Secondary | ICD-10-CM | POA: Diagnosis not present

## 2019-10-04 DIAGNOSIS — K635 Polyp of colon: Secondary | ICD-10-CM | POA: Insufficient documentation

## 2019-10-04 DIAGNOSIS — E669 Obesity, unspecified: Secondary | ICD-10-CM | POA: Insufficient documentation

## 2019-10-04 DIAGNOSIS — R1310 Dysphagia, unspecified: Secondary | ICD-10-CM | POA: Insufficient documentation

## 2019-10-04 DIAGNOSIS — K2980 Duodenitis without bleeding: Secondary | ICD-10-CM | POA: Diagnosis not present

## 2019-10-04 DIAGNOSIS — Z7982 Long term (current) use of aspirin: Secondary | ICD-10-CM | POA: Diagnosis not present

## 2019-10-04 DIAGNOSIS — Z86010 Personal history of colonic polyps: Secondary | ICD-10-CM | POA: Insufficient documentation

## 2019-10-04 DIAGNOSIS — K269 Duodenal ulcer, unspecified as acute or chronic, without hemorrhage or perforation: Secondary | ICD-10-CM | POA: Diagnosis not present

## 2019-10-04 DIAGNOSIS — Z8546 Personal history of malignant neoplasm of prostate: Secondary | ICD-10-CM | POA: Insufficient documentation

## 2019-10-04 DIAGNOSIS — K2970 Gastritis, unspecified, without bleeding: Secondary | ICD-10-CM | POA: Insufficient documentation

## 2019-10-04 DIAGNOSIS — E1122 Type 2 diabetes mellitus with diabetic chronic kidney disease: Secondary | ICD-10-CM | POA: Insufficient documentation

## 2019-10-04 DIAGNOSIS — N189 Chronic kidney disease, unspecified: Secondary | ICD-10-CM | POA: Insufficient documentation

## 2019-10-04 DIAGNOSIS — K219 Gastro-esophageal reflux disease without esophagitis: Secondary | ICD-10-CM | POA: Diagnosis not present

## 2019-10-04 DIAGNOSIS — Z1211 Encounter for screening for malignant neoplasm of colon: Secondary | ICD-10-CM | POA: Insufficient documentation

## 2019-10-04 HISTORY — PX: COLONOSCOPY WITH PROPOFOL: SHX5780

## 2019-10-04 HISTORY — DX: Other forms of chronic ischemic heart disease: I25.89

## 2019-10-04 HISTORY — DX: Chronic kidney disease, unspecified: N18.9

## 2019-10-04 HISTORY — DX: Chronic coronary microvascular dysfunction: I25.85

## 2019-10-04 HISTORY — PX: ESOPHAGOGASTRODUODENOSCOPY (EGD) WITH PROPOFOL: SHX5813

## 2019-10-04 LAB — GLUCOSE, CAPILLARY: Glucose-Capillary: 196 mg/dL — ABNORMAL HIGH (ref 70–99)

## 2019-10-04 SURGERY — COLONOSCOPY WITH PROPOFOL
Anesthesia: General

## 2019-10-04 MED ORDER — PROPOFOL 10 MG/ML IV BOLUS
INTRAVENOUS | Status: DC | PRN
  Administered 2019-10-04: 50 mg via INTRAVENOUS

## 2019-10-04 MED ORDER — GLYCOPYRROLATE 0.2 MG/ML IJ SOLN
INTRAMUSCULAR | Status: DC | PRN
  Administered 2019-10-04 (×2): .2 mg via INTRAVENOUS

## 2019-10-04 MED ORDER — MIDAZOLAM HCL 2 MG/2ML IJ SOLN
INTRAMUSCULAR | Status: AC
  Filled 2019-10-04: qty 2

## 2019-10-04 MED ORDER — LIDOCAINE HCL (CARDIAC) PF 100 MG/5ML IV SOSY
PREFILLED_SYRINGE | INTRAVENOUS | Status: DC | PRN
  Administered 2019-10-04: 100 mg via INTRAVENOUS

## 2019-10-04 MED ORDER — PROPOFOL 500 MG/50ML IV EMUL
INTRAVENOUS | Status: DC | PRN
  Administered 2019-10-04: 165 ug/kg/min via INTRAVENOUS

## 2019-10-04 MED ORDER — MIDAZOLAM HCL 2 MG/2ML IJ SOLN
INTRAMUSCULAR | Status: DC | PRN
  Administered 2019-10-04 (×2): 1 mg via INTRAVENOUS

## 2019-10-04 MED ORDER — EPHEDRINE SULFATE 50 MG/ML IJ SOLN
INTRAMUSCULAR | Status: DC | PRN
  Administered 2019-10-04 (×2): 5 mg via INTRAVENOUS

## 2019-10-04 MED ORDER — SODIUM CHLORIDE 0.9 % IV SOLN: INTRAVENOUS | Status: DC

## 2019-10-04 NOTE — Transfer of Care (Signed)
Immediate Anesthesia Transfer of Care Note  Patient: Scott Skinner  Procedure(s) Performed: COLONOSCOPY WITH PROPOFOL (N/A ) ESOPHAGOGASTRODUODENOSCOPY (EGD) WITH PROPOFOL (N/A )  Patient Location: Endoscopy Unit  Anesthesia Type:General  Level of Consciousness: drowsy and responds to stimulation  Airway & Oxygen Therapy: Patient Spontanous Breathing and Patient connected to face mask oxygen  Post-op Assessment: Report given to RN and Post -op Vital signs reviewed and stable  Post vital signs: Reviewed and stable  Last Vitals:  Vitals Value Taken Time  BP 118/64 10/04/19 1103  Temp    Pulse 73 10/04/19 1105  Resp 15 10/04/19 1105  SpO2 100 % 10/04/19 1105  Vitals shown include unvalidated device data.  Last Pain:  Vitals:   10/04/19 1103  TempSrc:   PainSc: Asleep         Complications: No complications documented.

## 2019-10-04 NOTE — Interval H&P Note (Signed)
History and Physical Interval Note:  10/04/2019 10:35 AM  Scott Skinner  has presented today for surgery, with the diagnosis of DYSPHAGIA PH POLYPS.  The various methods of treatment have been discussed with the patient and family. After consideration of risks, benefits and other options for treatment, the patient has consented to  Procedure(s): COLONOSCOPY WITH PROPOFOL (N/A) ESOPHAGOGASTRODUODENOSCOPY (EGD) WITH PROPOFOL (N/A) as a surgical intervention.  The patient's history has been reviewed, patient examined, no change in status, stable for surgery.  I have reviewed the patient's chart and labs.  Questions were answered to the patient's satisfaction.     [place], [place]

## 2019-10-04 NOTE — Anesthesia Procedure Notes (Signed)
Procedure Name: General with mask airway Performed by: [name], Clemons Salvucci, CRNA Pre-anesthesia Checklist: Patient identified, Emergency Drugs available, Suction available and Patient being monitored Patient Re-evaluated:Patient Re-evaluated prior to induction Oxygen Delivery Method: Simple face mask Induction Type: IV induction Placement Confirmation: CO2 detector and positive ETCO2 Dental Injury: Teeth and Oropharynx as per pre-operative assessment        

## 2019-10-04 NOTE — Anesthesia Preprocedure Evaluation (Signed)
Anesthesia Evaluation  Patient identified by MRN, date of birth, ID band Patient awake    Reviewed: Allergy & Precautions, H&P , NPO status , Patient's Chart, lab work & pertinent test results, reviewed documented beta blocker date and time   History of Anesthesia Complications (+) history of anesthetic complications  Airway Mallampati: II   Neck ROM: full    Dental  (+) Poor Dentition   Pulmonary neg pulmonary ROS, sleep apnea and Continuous Positive Airway Pressure Ventilation ,    Pulmonary exam normal        Cardiovascular Exercise Tolerance: Good hypertension, On Medications + angina with exertion negative cardio ROS Normal cardiovascular exam Rhythm:regular Rate:Normal     Neuro/Psych PSYCHIATRIC DISORDERS negative neurological ROS  negative psych ROS   GI/Hepatic negative GI ROS, Neg liver ROS, GERD  Medicated,  Endo/Other  diabetes, Poorly Controlled, Type 2Morbid obesity  Renal/GU Renal diseasenegative Renal ROS  negative genitourinary   Musculoskeletal   Abdominal   Peds  Hematology negative hematology ROS (+)   Anesthesia Other Findings Past Medical History: No date: Cancer ([place])     Comment:  prostate cancer  No date: Chronic kidney disease No date: Complication of anesthesia     Comment:  anaphylaxis  No date: Diabetes mellitus without complication (HCC) No date: GERD (gastroesophageal reflux disease) No date: Gout No date: History of kidney stones No date: Hyperlipidemia No date: Hypertension 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 No date: Obesity No date: OSA (obstructive sleep apnea) No date: Prostate cancer [facility]) Past Surgical History: 10/05/2016: COLONOSCOPY WITH PROPOFOL; N/A     Comment:  Procedure: COLONOSCOPY WITH PROPOFOL;  Surgeon:               [name], MD;  Location: [facility] ENDOSCOPY;                Service: Endoscopy;  Laterality: N/A; 1977: CYST REMOVAL  TRUNK 05/17/2019: LEFT HEART CATH AND CORONARY ANGIOGRAPHY; Left     Comment:  Procedure: LEFT HEART CATH AND CORONARY ANGIOGRAPHY;                Surgeon: Corey Skains, MD;  Location: [facility]               CV LAB;  Service: Cardiovascular;  Laterality: Left; 2005: NECK SURGERY No date: PROSTATE SURGERY BMI    Body Mass Index: 31.97 kg/m     Reproductive/Obstetrics negative OB ROS                             Anesthesia Physical Anesthesia Plan  ASA: III  Anesthesia Plan: General   Post-op Pain Management:    Induction:   PONV Risk Score and Plan:   Airway Management Planned:   Additional Equipment:   Intra-op Plan:   Post-operative Plan:   Informed Consent: I have reviewed the patients History and Physical, chart, labs and discussed the procedure including the risks, benefits and alternatives for the proposed anesthesia with the patient or authorized representative who has indicated his/her understanding and acceptance.     Dental Advisory Given  Plan Discussed with: CRNA  Anesthesia Plan Comments:         Anesthesia Quick Evaluation

## 2019-10-04 NOTE — Op Note (Signed)
[facility] Gastroenterology Patient Name: Scott Skinner Procedure Date: 10/04/2019 10:31 AM MRN: [id number] Account #: [id number] Date of Birth: 05-Aug-1950 Admit Type: Outpatient Age: 68 Room: [facility] ENDO ROOM 3 Gender: Male Note Status: Finalized Procedure:             Upper GI endoscopy Indications:           Dysphagia, Abnormal cine-esophagram Providers:             [name]. Islam Eichinger MD, MD Medicines:             Propofol per Anesthesia Complications:         No immediate complications. Procedure:             Pre-Anesthesia Assessment:                        - The risks and benefits of the procedure and the                         sedation options and risks were discussed with the                         patient. All questions were answered and informed                         consent was obtained.                        - Patient identification and proposed procedure were                         verified prior to the procedure by the nurse. The                         procedure was verified in the procedure room.                        - ASA Grade Assessment: III - A patient with severe                         systemic disease.                        - After reviewing the risks and benefits, the patient                         was deemed in satisfactory condition to undergo the                         procedure.                        After obtaining informed consent, the endoscope was                         passed under direct vision. Throughout the procedure,                         the patient's blood pressure, pulse, and oxygen  saturations were monitored continuously. The Endoscope                         was introduced through the mouth, and advanced to the                         third part of duodenum. The upper GI endoscopy was                         accomplished without difficulty. The patient tolerated                          the procedure well. Findings:      One benign-appearing, intrinsic mild stenosis was found at the       gastroesophageal junction. This stenosis measured 1.6 cm (inner       diameter) x less than one cm (in length). The stenosis was traversed.       The scope was withdrawn. Dilation was performed with a Maloney dilator       with no resistance at 29 Fr.      Patchy moderate inflammation characterized by congestion (edema), linear       erosions and aphthous ulcerations was found in the gastric antrum.       Biopsies were taken with a cold forceps for Helicobacter pylori testing.      The cardia and gastric fundus were normal on retroflexion.      Multiple localized, small erosions without bleeding were found in the       duodenal bulb.      Two non-bleeding superficial duodenal ulcers with no stigmata of       bleeding were found in the duodenal bulb. The largest lesion was 3 mm in       largest dimension.      The exam was otherwise without abnormality. Impression:            - Benign-appearing esophageal stenosis. Dilated.                        - Gastritis. Biopsied.                        - Erosive duodenopathy without bleeding.                        - Non-bleeding duodenal ulcers with no stigmata of                         bleeding.                        - The examination was otherwise normal. Recommendation:        - Await pathology results.                        - Monitor results to esophageal dilation                        - Proceed with colonoscopy Procedure Code(s):     --- Professional ---                        [id number], Esophagogastroduodenoscopy,  flexible,                         transoral; with biopsy, single or multiple                        43450, Dilation of esophagus, by unguided sound or                         bougie, single or multiple passes Diagnosis Code(s):     --- Professional ---                        R93.3, Abnormal findings on diagnostic imaging of                          other parts of digestive tract                        R13.10, Dysphagia, unspecified                        K26.9, Duodenal ulcer, unspecified as acute or                         chronic, without hemorrhage or perforation                        K31.89, Other diseases of stomach and duodenum                        K29.70, Gastritis, unspecified, without bleeding                        K22.2, Esophageal obstruction CPT copyright 2019 American Medical Association. All rights reserved. The codes documented in this report are preliminary and upon coder review may  be revised to meet current compliance requirements. Efrain Sella MD, MD 10/04/2019 10:48:17 AM This report has been signed electronically. Number of Addenda: 0 Note Initiated On: 10/04/2019 10:31 AM Estimated Blood Loss:  Estimated blood loss: none.      [facility]

## 2019-10-04 NOTE — H&P (Signed)
Outpatient short stay form Pre-procedure 10/04/2019 10:33 AM Scott Skinner Scott Skinner, M.D.  Primary Physician: Scott Skinner, M.D.  Reason for visit:  Dysphagia, personal history of adenomatous colon polyps  History of present illness:  Patient with 2 episodes of dysphagia while eating steak. BS xray showed tertiary contractions and GERD, no stricture of mass in the esophagus. Swallowing function appeared normal.                           Patient presents for colonoscopy for a personal hx of colon polyps. The patient denies abdominal pain, abnormal weight loss or rectal bleeding.      Current Facility-Administered Medications:  .  0.9 %  sodium chloride infusion, , Intravenous, Continuous, [place], Scott Pike, MD, Last Rate: 20 mL/hr at 10/04/19 1030, Continued from Pre-op at 10/04/19 1030  Facility-Administered Medications Ordered in Other Encounters:  .  glycopyrrolate (ROBINUL) injection, , Intravenous, Anesthesia Intra-op, Skinner, [name], CRNA, 0.2 mg at 10/04/19 1031 .  midazolam (VERSED) injection, , Intravenous, Anesthesia Intra-op, Skinner, [name], CRNA, 1 mg at 10/04/19 1031  Medications Prior to Admission  Medication Sig Dispense Refill Last Dose  . aspirin EC 81 MG tablet Take 81 mg by mouth daily.   Past Week at Unknown time  . cholecalciferol (VITAMIN D) 25 MCG (1000 UNIT) tablet Take 1,000 Units by mouth daily.   Past Week at Unknown time  . glucose blood (ACCU-CHEK ACTIVE STRIPS) test strip Use as instructed 100 each 12 10/03/2019 at Unknown time  . hydrochlorothiazide (HYDRODIURIL) 12.5 MG tablet Take 12.5 mg by mouth daily.   10/03/2019 at Unknown time  . indomethacin (INDOCIN) 25 MG capsule Take 25 mg by mouth 3 (three) times daily as needed (gout pain.).    Past Week at Unknown time  . isosorbide mononitrate (IMDUR) 30 MG 24 hr tablet Take 30 mg by mouth daily.   10/03/2019 at Unknown time  . lansoprazole (PREVACID) 15 MG capsule Take 15 mg by mouth daily  as needed (acid reflux/indigestion.).    Past Week at Unknown time  . loratadine (CLARITIN) 10 MG tablet Take 10 mg by mouth daily.   Past Week at Unknown time  . losartan (COZAAR) 100 MG tablet Take 100 mg by mouth daily.   10/03/2019 at Unknown time  . metFORMIN (GLUCOPHAGE) 500 MG tablet Take 1,000 mg by mouth in the morning and at bedtime.   Past Week at Unknown time  . metoprolol tartrate (LOPRESSOR) 25 MG tablet Take 25 mg by mouth 2 (two) times daily.   10/03/2019 at Unknown time  . Omega-3 1000 MG CAPS Take 1,000 mg by mouth daily.    Past Week at Unknown time  . PARoxetine (PAXIL) 30 MG tablet Take 30 mg by mouth daily.   10/03/2019 at Unknown time  . pravastatin (PRAVACHOL) 20 MG tablet Take 20 mg by mouth at bedtime.    10/03/2019 at Unknown time  . Probiotic Product (PROBIOTIC PO) Take 1 capsule by mouth in the morning and at bedtime.   Past Week at Unknown time  . tiZANidine (ZANAFLEX) 2 MG tablet Take 2 mg by mouth 2 (two) times daily as needed for muscle spasms.   Past Month at Unknown time  . TRULICITY 1.5 TU/8.8KC SOPN Inject 1.5 mg into the skin every Monday.   Past Month at Unknown time  . Turmeric 500 MG CAPS Take 500 mg by mouth daily.    Past Week at Unknown time  .  vitamin B-12 (CYANOCOBALAMIN) 1000 MCG tablet Take 1,000 mcg by mouth daily.   Past Week at Unknown time  . Zinc 100 MG TABS Take 100 mg by mouth daily.   Past Week at Unknown time  . ascorbic acid (CVS VITAMIN C) 500 MG tablet Take 500 mg by mouth daily.        Allergies  Allergen Reactions  . Other Anaphylaxis    ANESTHESIA-heart rate drop- per patient propofol      Past Medical History:  Diagnosis Date  . Cancer [facility])    prostate cancer   . Chronic kidney disease   . Complication of anesthesia    anaphylaxis   . Diabetes mellitus without complication ([place])   . GERD (gastroesophageal reflux disease)   . Gout   . History of kidney stones   . Hyperlipidemia   . Hypertension   . Microvascular  ischemia of myocardium   . Obesity   . OSA (obstructive sleep apnea)   . Prostate cancer [facility])     Review of systems:  Otherwise negative.    Physical Exam  Gen: Alert, oriented. Appears stated age.  HEENT: [place]/AT. PERRLA. Lungs: CTA, no wheezes. CV: RR nl S1, S2. Abd: soft, benign, no masses. BS+ Ext: No edema. Pulses 2+    Planned procedures: Proceed with EGD and colonoscopy. The patient understands the nature of the planned procedure, indications, risks, alternatives and potential complications including but not limited to bleeding, infection, perforation, damage to internal organs and possible oversedation/side effects from anesthesia. The patient agrees and gives consent to proceed.  Please refer to procedure notes for findings, recommendations and patient disposition/instructions.     Scott Skinner Scott Skinner, M.D. Gastroenterology 10/04/2019  10:33 AM

## 2019-10-04 NOTE — Op Note (Signed)
[facility] Gastroenterology Patient Name: Scott Skinner Procedure Date: 10/04/2019 10:31 AM MRN: [id number] Account #: [id number] Date of Birth: [birth year] Admit Type: Outpatient Age: 69 Room: [facility] ENDO ROOM 3 Gender: Male Note Status: Finalized Procedure:             Colonoscopy Indications:           Surveillance: Personal history of adenomatous polyps                         on last colonoscopy > 3 years ago Providers:             Lorie Apley K. Ryatt Corsino MD, MD Medicines:             Propofol per Anesthesia Complications:         No immediate complications. Procedure:             Pre-Anesthesia Assessment:                        - The risks and benefits of the procedure and the                         sedation options and risks were discussed with the                         patient. All questions were answered and informed                         consent was obtained.                        - Patient identification and proposed procedure were                         verified prior to the procedure by the nurse. The                         procedure was verified in the procedure room.                        - ASA Grade Assessment: III - A patient with severe                         systemic disease.                        - After reviewing the risks and benefits, the patient                         was deemed in satisfactory condition to undergo the                         procedure.                        After obtaining informed consent, the colonoscope was                         passed under direct vision. Throughout the procedure,  the patient's blood pressure, pulse, and oxygen                         saturations were monitored continuously. The                         Colonoscope was introduced through the anus and                         advanced to the the cecum, identified by appendiceal                         orifice and  ileocecal valve. The colonoscopy was                         performed without difficulty. The patient tolerated                         the procedure well. The quality of the bowel                         preparation was adequate. The ileocecal valve,                         appendiceal orifice, and rectum were photographed. Findings:      The perianal and digital rectal examinations were normal. Pertinent       negatives include normal sphincter tone, no palpable rectal lesions and       normal prostate (size, shape, and consistency).      A 3 mm polyp was found in the proximal ascending colon. The polyp was       sessile. The polyp was removed with a jumbo cold forceps. Resection and       retrieval were complete.      Non-bleeding internal hemorrhoids were found during retroflexion. The       hemorrhoids were Grade I (internal hemorrhoids that do not prolapse).      The exam was otherwise without abnormality. Impression:            - One 3 mm polyp in the proximal ascending colon,                         removed with a jumbo cold forceps. Resected and                         retrieved.                        - Non-bleeding internal hemorrhoids.                        - The examination was otherwise normal. Recommendation:        - Monitor results to esophageal dilation                        - Await pathology results from EGD, also performed                         today.                        -  Patient has a contact number available for                         emergencies. The signs and symptoms of potential                         delayed complications were discussed with the patient.                         Return to normal activities tomorrow. Written                         discharge instructions were provided to the patient.                        - Resume previous diet.                        - Continue present medications.                        - Repeat colonoscopy in 5  years for surveillance.                        - Return to nurse practitioner in 6 weeks.                        - Follow up with Stephens November, GI Nurse                         Practioner, in office to discuss results and monitor                         progress.                        - The findings and recommendations were discussed with                         the patient.                        - I discussed the need to take acid suppression                         medication every day and avoid NSAIDs where possible. Procedure Code(s):     --- Professional ---                        [phone], Colonoscopy, flexible; with biopsy, single or                         multiple Diagnosis Code(s):     --- Professional ---                        K64.0, First degree hemorrhoids                        K63.5, Polyp of colon  Z86.010, Personal history of colonic polyps CPT copyright 2019 American Medical Association. All rights reserved. The codes documented in this report are preliminary and upon coder review may  be revised to meet current compliance requirements. Efrain Sella MD, MD 10/04/2019 11:03:07 AM This report has been signed electronically. Number of Addenda: 0 Note Initiated On: 10/04/2019 10:31 AM Scope Withdrawal Time: 0 hours 8 minutes 8 seconds  Total Procedure Duration: 0 hours 9 minutes 58 seconds  Estimated Blood Loss:  Estimated blood loss: none.      [facility]

## 2019-10-05 ENCOUNTER — Encounter: Payer: Self-pay | Admitting: Internal Medicine

## 2019-10-06 LAB — SURGICAL PATHOLOGY

## 2019-10-09 NOTE — Anesthesia Postprocedure Evaluation (Signed)
Anesthesia Post Note  Patient: Scott Skinner  Procedure(s) Performed: COLONOSCOPY WITH PROPOFOL (N/A ) ESOPHAGOGASTRODUODENOSCOPY (EGD) WITH PROPOFOL (N/A )  Patient location during evaluation: PACU Anesthesia Type: General Level of consciousness: awake and alert Pain management: pain level controlled Vital Signs Assessment: post-procedure vital signs reviewed and stable Respiratory status: spontaneous breathing, nonlabored ventilation, respiratory function stable and patient connected to nasal cannula oxygen Cardiovascular status: blood pressure returned to baseline and stable Postop Assessment: no apparent nausea or vomiting Anesthetic complications: no   No complications documented.   Last Vitals:  Vitals:   10/04/19 1113 10/04/19 1123  BP: 129/75 (!) 142/86  Pulse: 74 77  Resp: 16 16  Temp:    SpO2: 100% 100%    Last Pain:  Vitals:   10/04/19 1123  TempSrc:   PainSc: 0-No pain                 Molli Barrows

## 2019-10-13 DIAGNOSIS — Z8546 Personal history of malignant neoplasm of prostate: Secondary | ICD-10-CM | POA: Insufficient documentation

## 2020-10-04 ENCOUNTER — Other Ambulatory Visit: Payer: Self-pay | Admitting: Neurosurgery

## 2020-10-04 DIAGNOSIS — M5416 Radiculopathy, lumbar region: Secondary | ICD-10-CM

## 2020-10-14 ENCOUNTER — Ambulatory Visit
Admission: RE | Admit: 2020-10-14 | Discharge: 2020-10-14 | Disposition: A | Discharge status: Home / Self Care | Payer: Medicare Other | Source: Ambulatory Visit | Attending: Neurosurgery | Admitting: Neurosurgery

## 2020-10-14 ENCOUNTER — Other Ambulatory Visit: Payer: Self-pay

## 2020-10-14 DIAGNOSIS — M5416 Radiculopathy, lumbar region: Secondary | ICD-10-CM

## 2020-11-13 DIAGNOSIS — Z8739 Personal history of other diseases of the musculoskeletal system and connective tissue: Secondary | ICD-10-CM | POA: Insufficient documentation

## 2020-12-11 ENCOUNTER — Encounter: Payer: Self-pay | Admitting: Podiatry

## 2020-12-11 ENCOUNTER — Other Ambulatory Visit: Payer: Self-pay

## 2020-12-11 ENCOUNTER — Ambulatory Visit (INDEPENDENT_AMBULATORY_CARE_PROVIDER_SITE_OTHER): Payer: Medicare Other | Admitting: Podiatry

## 2020-12-11 ENCOUNTER — Ambulatory Visit (INDEPENDENT_AMBULATORY_CARE_PROVIDER_SITE_OTHER): Payer: Medicare Other

## 2020-12-11 DIAGNOSIS — M7662 Achilles tendinitis, left leg: Secondary | ICD-10-CM

## 2020-12-11 DIAGNOSIS — B353 Tinea pedis: Secondary | ICD-10-CM | POA: Diagnosis not present

## 2020-12-11 MED ORDER — DEXAMETHASONE SODIUM PHOSPHATE 120 MG/30ML IJ SOLN
2.0000 mg | Freq: Once | INTRAMUSCULAR | Status: AC
  Administered 2020-12-11: 2 mg via INTRA_ARTICULAR

## 2020-12-11 MED ORDER — TERBINAFINE HCL 250 MG PO TABS: 250.0000 mg | ORAL_TABLET | Freq: Every day | ORAL | 0 refills | Status: DC

## 2020-12-11 NOTE — Progress Notes (Signed)
Subjective:  Patient ID: Scott Skinner, male    DOB: 1951/01/04,  MRN: [id number] HPI Chief Complaint  Patient presents with   Foot Pain    Posterior heel/achilles left - aching x 1 month   Skin Problem    Bilateral feet (plantar and sides) - peeling skin, tried multiple athlete's feet creams   New Patient (Initial Visit)   Diabetes    Last a1c 7.8    70 y.o. male presents with the above complaint.   ROS: Denies fever chills nausea body muscle aches pains calf pain back pain chest pain shortness of breath.  Past Medical History:  Diagnosis Date   Cancer [facility])    prostate cancer    Chronic kidney disease    Complication of anesthesia    anaphylaxis    Diabetes mellitus without complication ([facility])    GERD (gastroesophageal reflux disease)    Gout    History of kidney stones    Hyperlipidemia    Hypertension    Microvascular ischemia of myocardium    Obesity    OSA (obstructive sleep apnea)    Prostate cancer [facility])    Past Surgical History:  Procedure Laterality Date   COLONOSCOPY WITH PROPOFOL N/A 10/05/2016   Procedure: COLONOSCOPY WITH PROPOFOL;  Surgeon: [name], MD;  Location: [facility] ENDOSCOPY;  Service: Endoscopy;  Laterality: N/A;   COLONOSCOPY WITH PROPOFOL N/A 10/04/2019   Procedure: COLONOSCOPY WITH PROPOFOL;  Surgeon: Toledo, [name], MD;  Location: [facility] ENDOSCOPY;  Service: Gastroenterology;  Laterality: N/A;   CYST REMOVAL TRUNK  1977   ESOPHAGOGASTRODUODENOSCOPY (EGD) WITH PROPOFOL N/A 10/04/2019   Procedure: ESOPHAGOGASTRODUODENOSCOPY (EGD) WITH PROPOFOL;  Surgeon: Toledo, [name], MD;  Location: [facility] ENDOSCOPY;  Service: Gastroenterology;  Laterality: N/A;   LEFT HEART CATH AND CORONARY ANGIOGRAPHY Left 05/17/2019   Procedure: LEFT HEART CATH AND CORONARY ANGIOGRAPHY;  Surgeon: Corey Skains, MD;  Location: [place] CV LAB;  Service: Cardiovascular;  Laterality: Left;   NECK SURGERY  2005   PROSTATE SURGERY      Current Outpatient  Medications:    terbinafine (LAMISIL) 250 MG tablet, Take 1 tablet (250 mg total) by mouth daily., Disp: 30 tablet, Rfl: 0   amLODipine (NORVASC) 2.5 MG tablet, Take 2.5 mg by mouth daily., Disp: , Rfl:    ascorbic acid (CVS VITAMIN C) 500 MG tablet, Take 500 mg by mouth daily., Disp: , Rfl:    aspirin EC 81 MG tablet, Take 81 mg by mouth daily., Disp: , Rfl:    cholecalciferol (VITAMIN D) 25 MCG (1000 UNIT) tablet, Take 1,000 Units by mouth daily., Disp: , Rfl:    escitalopram (LEXAPRO) 5 MG tablet, Take 5 mg by mouth daily., Disp: , Rfl:    glipiZIDE (GLUCOTROL) 10 MG tablet, Take by mouth., Disp: , Rfl:    glucose blood (ACCU-CHEK ACTIVE STRIPS) test strip, Use as instructed, Disp: 100 each, Rfl: 12   hydrochlorothiazide (HYDRODIURIL) 12.5 MG tablet, Take 12.5 mg by mouth daily., Disp: , Rfl:    indomethacin (INDOCIN) 25 MG capsule, Take 25 mg by mouth 3 (three) times daily as needed (gout pain.). , Disp: , Rfl:    isosorbide mononitrate (IMDUR) 30 MG 24 hr tablet, Take 30 mg by mouth daily., Disp: , Rfl:    lansoprazole (PREVACID) 15 MG capsule, Take 15 mg by mouth daily as needed (acid reflux/indigestion.). , Disp: , Rfl:    loratadine (CLARITIN) 10 MG tablet, Take 10 mg by mouth daily., Disp: , Rfl:  losartan (COZAAR) 100 MG tablet, Take 100 mg by mouth daily., Disp: , Rfl:    metFORMIN (GLUCOPHAGE) 500 MG tablet, Take 1,000 mg by mouth in the morning and at bedtime., Disp: , Rfl:    metoprolol tartrate (LOPRESSOR) 25 MG tablet, Take 25 mg by mouth 2 (two) times daily., Disp: , Rfl:    Omega-3 1000 MG CAPS, Take 1,000 mg by mouth daily. , Disp: , Rfl:    PARoxetine (PAXIL) 30 MG tablet, Take 30 mg by mouth daily., Disp: , Rfl:    pravastatin (PRAVACHOL) 20 MG tablet, Take 20 mg by mouth at bedtime. , Disp: , Rfl:    Probiotic Product (PROBIOTIC PO), Take 1 capsule by mouth in the morning and at bedtime., Disp: , Rfl:    tiZANidine (ZANAFLEX) 2 MG tablet, Take 2 mg by mouth 2 (two) times  daily as needed for muscle spasms., Disp: , Rfl:    TRULICITY 1.5 OH/6.0VP SOPN, Inject 1.5 mg into the skin every Monday., Disp: , Rfl:    Turmeric 500 MG CAPS, Take 500 mg by mouth daily. , Disp: , Rfl:    vitamin B-12 (CYANOCOBALAMIN) 1000 MCG tablet, Take 1,000 mcg by mouth daily., Disp: , Rfl:    Zinc 100 MG TABS, Take 100 mg by mouth daily., Disp: , Rfl:   Allergies  Allergen Reactions   Other Anaphylaxis    ANESTHESIA-heart rate drop- per patient propofol    Review of Systems Objective:  There were no vitals filed for this visit.  General: Well developed, nourished, in no acute distress, alert and oriented x3   Dermatological: Skin is warm, dry and supple bilateral. Nails x 10 are well maintained; remaining integument appears unremarkable at this time. There are no open sores, no preulcerative lesions, he does demonstrate down tinea pedis plantar aspect of the bilateral foot. Vascular: Dorsalis Pedis artery and Posterior Tibial artery pedal pulses are 2/4 bilateral with immedate capillary fill time. Pedal hair growth present. No varicosities and no lower extremity edema present bilateral.   Neruologic: Grossly intact via light touch bilateral. Vibratory intact via tuning fork bilateral. Protective threshold with Semmes Wienstein monofilament intact to all pedal sites bilateral. Patellar and Achilles deep tendon reflexes 2+ bilateral. No Babinski or clonus noted bilateral.   Musculoskeletal: No gross boney pedal deformities bilateral. No pain, crepitus, or limitation noted with foot and ankle range of motion bilateral. Muscular strength 5/5 in all groups tested bilateral.  He has moderate to severe pain on palpation of the Achilles tendinous insertion site of the left heel.  Gait: Unassisted, Nonantalgic.    Radiographs:  Radiographs taken today demonstrate retrocalcaneal heel spur with soft tissue thickening of the Achilles.  Assessment & Plan:   Assessment: Posterior  Achilles tendinitis retrocalcaneal heel spur.  Tinea pedis.  Plan: Injected his posterior heel today 2 mg of dexamethasone and local anesthetic.  Placed in a night splint discussed appropriate shoe gear stretching exercise ice therapy and shoe modifications.  Started him on Lamisil tablets 250 mg tablets 1 p.o. daily just for 30 days.     Bracen Schum [address], [place]

## 2020-12-27 ENCOUNTER — Telehealth: Payer: Self-pay | Admitting: *Deleted

## 2020-12-27 MED ORDER — METHYLPREDNISOLONE 4 MG PO TBPK: ORAL_TABLET | ORAL | 0 refills | Status: DC

## 2020-12-27 MED ORDER — MELOXICAM 15 MG PO TABS: 15.0000 mg | ORAL_TABLET | Freq: Every day | ORAL | 0 refills | Status: DC

## 2020-12-27 NOTE — Addendum Note (Signed)
Addended by: Lolita Rieger on: 12/27/2020 01:04 PM   Modules accepted: Orders

## 2020-12-27 NOTE — Telephone Encounter (Signed)
I called and informed the patient that Dr. Milinda Pointer was out of the office today.  I told him that Dr. Posey Pronto said he could prescribe him a Medrol Dose Pack and a prescription for Mobic.  I advised him to take the dose pak first then once he's finished with that to start taking the Mobic.  He asked that the prescriptions be sent to [facility] on [address] in [place].   The prescriptions were sent.

## 2020-12-27 NOTE — Telephone Encounter (Signed)
"  I'm a patient of Dr. Milinda Pointer.  The reason I'm calling is that I want to get a message to Dr. Milinda Pointer.  I have a bone spur on my left heel.  I want to find out what medications he would recommend for easing the pain.  The heel has gotten inflammed.  Ask him and get back to me before 24 hrs.  I'd like to know ASAP.  I look forward to your call."

## 2020-12-31 ENCOUNTER — Telehealth: Payer: Self-pay | Admitting: Podiatry

## 2020-12-31 NOTE — Telephone Encounter (Signed)
Patient came in to [facility] today stating he called last week and one of the MD called in anti inflammatory medication and it is not helping. Patient states he would like to get an MRI done because the pain is getting worse. Patient was wondering if you could order MRI and maybe try something else to help with pain. [facility] uses [facility] in [place] as his pharmacy. Patients telephone number is [phone].

## 2021-01-01 DIAGNOSIS — M7662 Achilles tendinitis, left leg: Secondary | ICD-10-CM | POA: Diagnosis not present

## 2021-01-01 NOTE — Telephone Encounter (Signed)
Called pt this morning to follow up on phone call. No answer LM on VM for pt to call the office.

## 2021-01-01 NOTE — Telephone Encounter (Signed)
Patient called back spoke with him let him know what Dr Milinda Pointer messaged back to me. Patient coming in for boot this afternoon.

## 2021-01-15 ENCOUNTER — Ambulatory Visit: Payer: Medicare Other | Admitting: Podiatry

## 2021-01-20 ENCOUNTER — Other Ambulatory Visit: Payer: Self-pay

## 2021-01-20 ENCOUNTER — Ambulatory Visit (INDEPENDENT_AMBULATORY_CARE_PROVIDER_SITE_OTHER): Payer: Medicare Other | Admitting: Podiatry

## 2021-01-20 ENCOUNTER — Encounter: Payer: Self-pay | Admitting: Podiatry

## 2021-01-20 DIAGNOSIS — M7662 Achilles tendinitis, left leg: Secondary | ICD-10-CM | POA: Diagnosis not present

## 2021-01-20 MED ORDER — DEXAMETHASONE SODIUM PHOSPHATE 120 MG/30ML IJ SOLN
2.0000 mg | Freq: Once | INTRAMUSCULAR | Status: AC
  Administered 2021-01-20: 2 mg via INTRA_ARTICULAR

## 2021-01-20 NOTE — Progress Notes (Signed)
He presents today for follow-up of his Achilles tendinitis left.  He states that is been pretty good really all depends on how much I do.  Objective: Vital signs are stable he is alert and oriented x3.  He relates that he takes his meloxicam as needed and he wears the boot occasionally.  Objective: Vital signs are stable he is alert and oriented x3 he has moderate to severe tenderness on palpation of the posterior lateral aspect of his Achilles area.  There is no erythema edema cellulitis drainage or odor.  Assessment: Insertional Achilles tendinitis.  Plan: I recommended another injection 2 mg of dexamethasone subcutaneous at the point of maximal tenderness making sure not to inject into the tendon itself.  He will continue his medication on a regular basis and to continue his wearing his boot on a regular basis and I will follow-up with him in a few weeks if not improved MRI be necessary.

## 2021-01-23 ENCOUNTER — Other Ambulatory Visit: Payer: Self-pay | Admitting: Podiatry

## 2021-01-23 NOTE — Telephone Encounter (Signed)
Please advise 

## 2021-02-19 ENCOUNTER — Other Ambulatory Visit: Payer: Self-pay

## 2021-02-19 ENCOUNTER — Ambulatory Visit (INDEPENDENT_AMBULATORY_CARE_PROVIDER_SITE_OTHER): Payer: Medicare Other | Admitting: Podiatry

## 2021-02-19 ENCOUNTER — Encounter: Payer: Self-pay | Admitting: Podiatry

## 2021-02-19 DIAGNOSIS — M7662 Achilles tendinitis, left leg: Secondary | ICD-10-CM

## 2021-02-19 DIAGNOSIS — S86012A Strain of left Achilles tendon, initial encounter: Secondary | ICD-10-CM | POA: Diagnosis not present

## 2021-02-19 DIAGNOSIS — L603 Nail dystrophy: Secondary | ICD-10-CM | POA: Diagnosis not present

## 2021-02-19 MED ORDER — MELOXICAM 15 MG PO TABS: 15.0000 mg | ORAL_TABLET | Freq: Every day | ORAL | 3 refills | Status: DC

## 2021-02-19 NOTE — Progress Notes (Signed)
He presents today for follow-up of his Achilles tendinitis of his left foot.  He states that wearing the boot but I do not wear it every day most of the time I can get to the end of the day for starts to hurt if I am on my foot a lot without the boot on.  He states the meloxicam really seems to help.  He is also concerned about the discoloration of his hallux nail left.  Objective: Vital signs are stable alert oriented x3 hallux nail left does demonstrate a thickened yellow dystrophic possibly mycotic nail plate.  He also has pain on palpation of the Achilles tendinitis left heel.  It is warm and thick tender on palpation.  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  Making sure that there is no tear.  Due to the chronicity of his symptoms I feel that ruling out a tear particularly in insertional split tear we would be better able to treat him long-term.

## 2021-02-19 NOTE — Addendum Note (Signed)
Addended by: Tyson Dense T on: 02/19/2021 12:19 PM   Modules accepted: Orders

## 2021-02-25 ENCOUNTER — Other Ambulatory Visit: Payer: Self-pay | Admitting: *Deleted

## 2021-02-25 MED ORDER — MELOXICAM 15 MG PO TABS: 15.0000 mg | ORAL_TABLET | Freq: Every day | ORAL | 3 refills | Status: AC

## 2021-02-28 IMAGING — RF DG ESOPHAGUS
8 of 11 series · 14 of 23 positions shown · non-contrast
Comparison: None.

CLINICAL DATA: Dysphagia without esophagitis.

EXAM:
ESOPHOGRAM / BARIUM SWALLOW / BARIUM TABLET STUDY
TECHNIQUE: Combined double contrast and single contrast examination performed
using effervescent crystals, thick barium liquid, and thin barium
liquid. The patient was observed with fluoroscopy swallowing a 13 mm
barium sulphate tablet.
FLUOROSCOPY TIME:  Fluoroscopy Time:  24 seconds
Radiation Exposure Index (if provided by the fluoroscopic device):
7.6 mGy
Number of Acquired Spot Images: 0

[Series 1: cp_standard · 0.25mm/px · 2 of 19 frames shown (1 of 8)]
[frame 3/19]
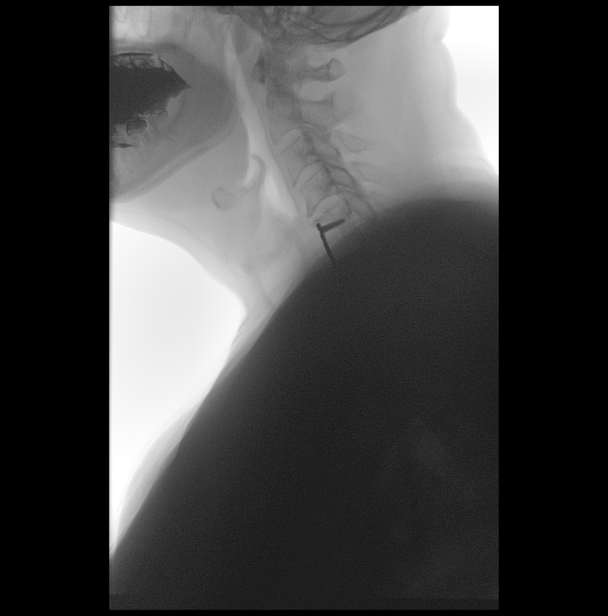
[frame 12/19]
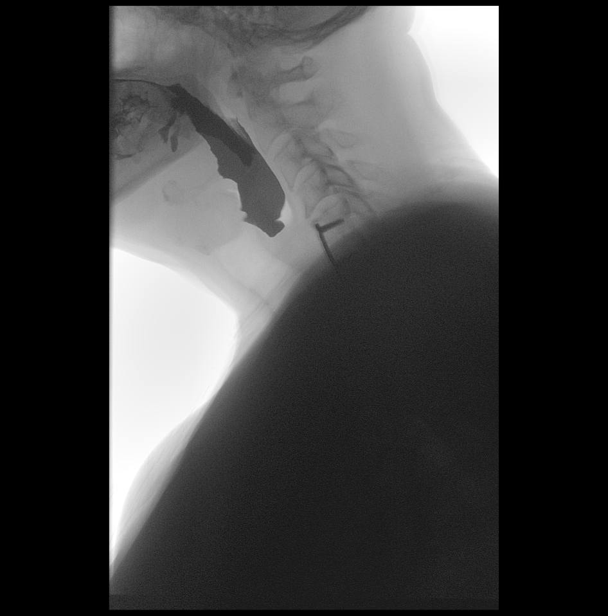

[Series 2: cp_standard · 0.26mm/px · 3 of 13 frames shown (2 of 8)]
[frame 1/13]
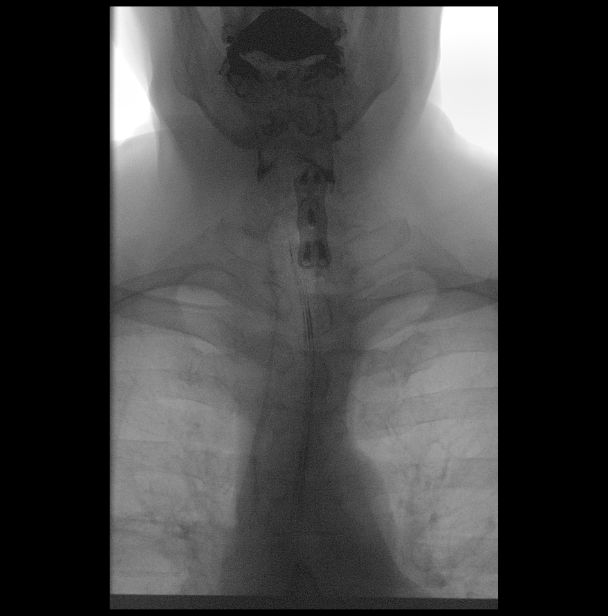
[frame 2/13]
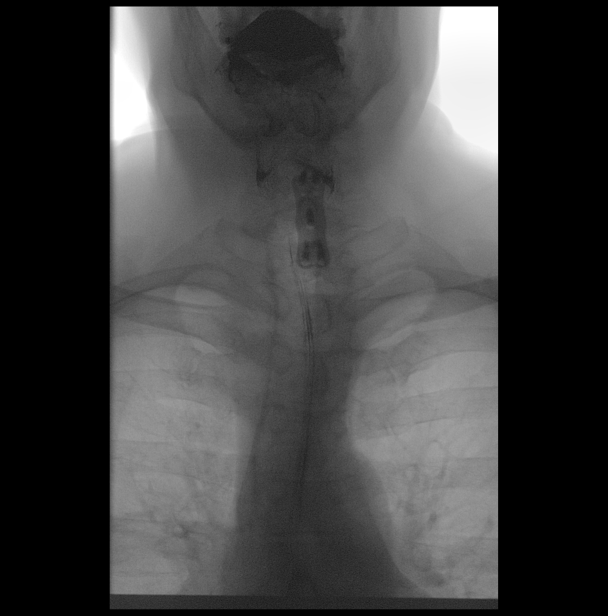
[frame 12/13]
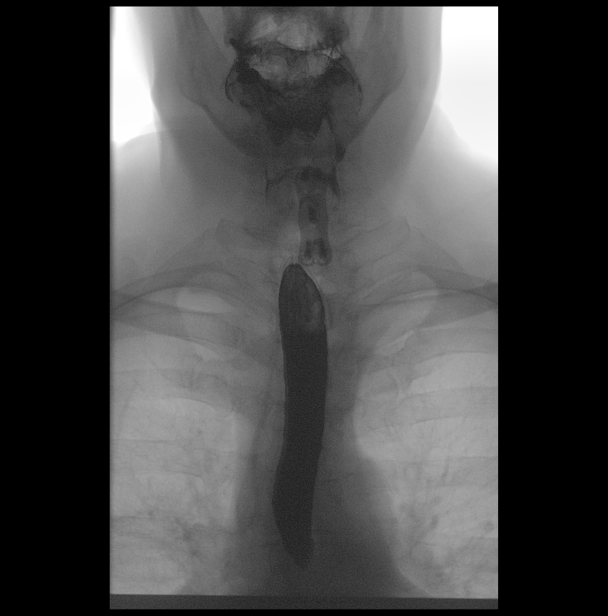

[Series 3: cp_standard · 0.25mm/px · 2 of 11 frames shown (3 of 8)]
[frame 6/11]
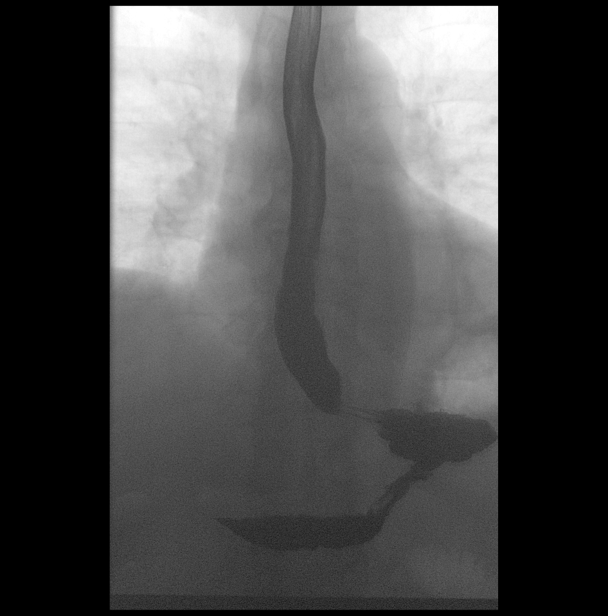
[frame 10/11]
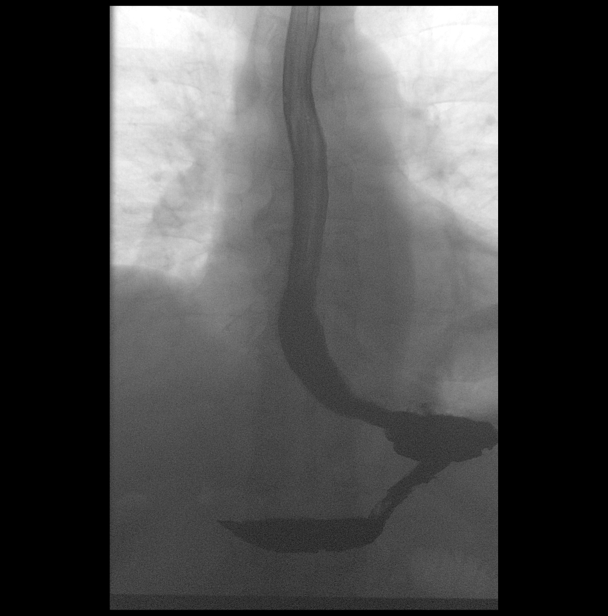

[Series 4: cp_standard · 0.25mm/px · 3 of 48 frames shown (4 of 8)]
[frame 8/48]
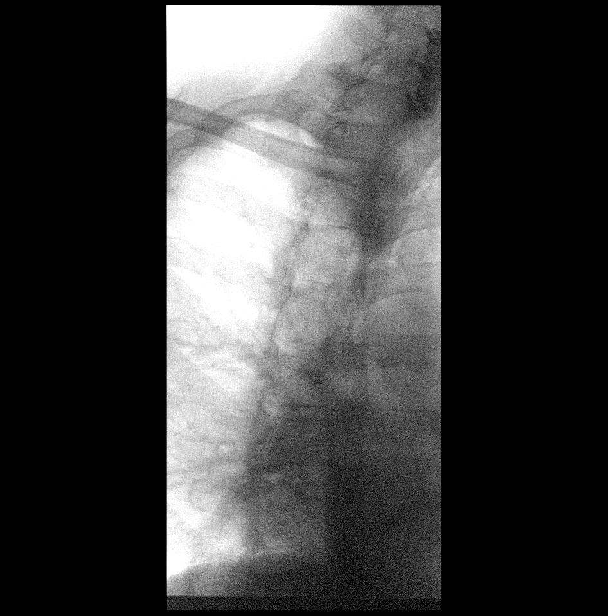
[frame 10/48]
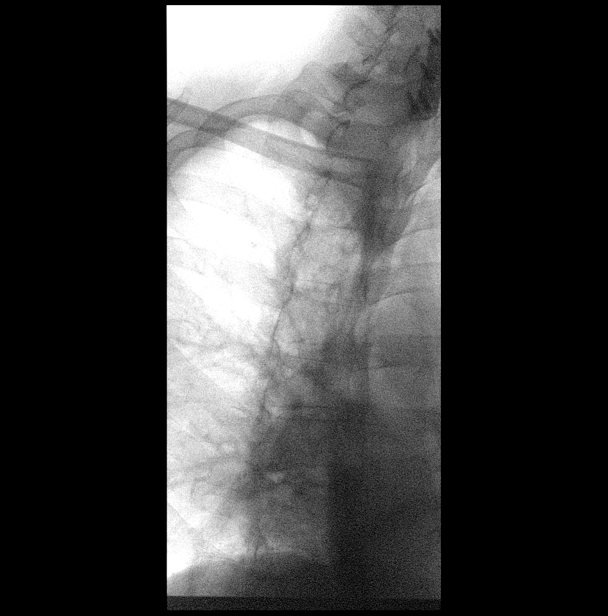
[frame 41/48]
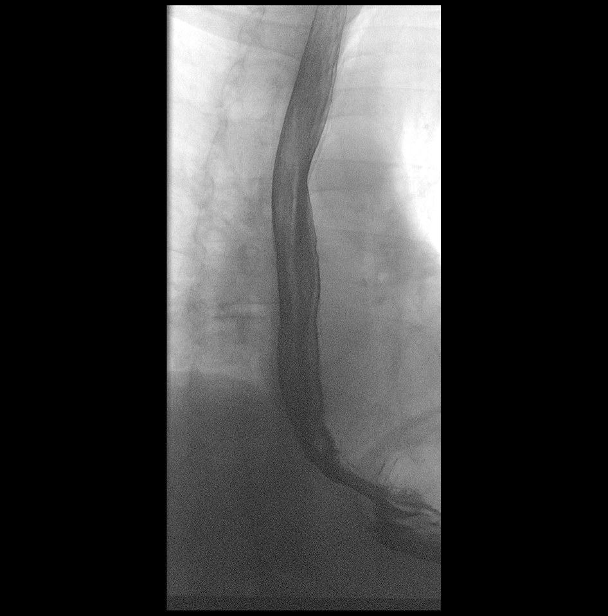

[Series 6: cp_standard · 0.28mm/px · 1 of 1 slices shown (5 of 8)]
[im 1/1]
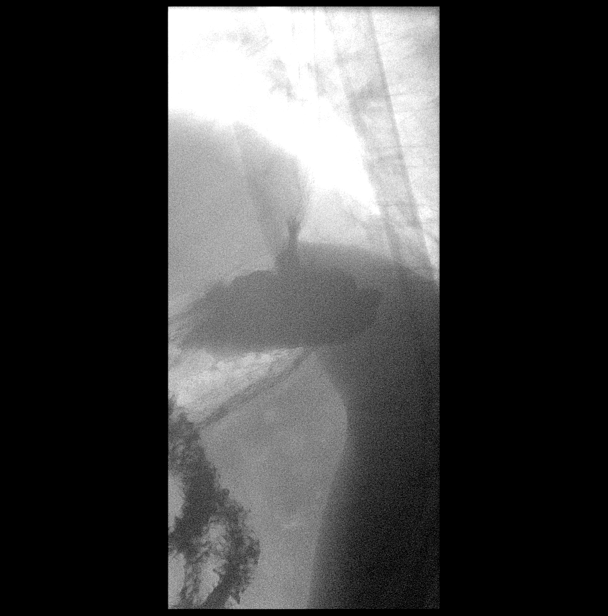

[Series 7: cp_standard · 0.28mm/px · 1 of 1 slices shown (6 of 8)]
[im 1/1]
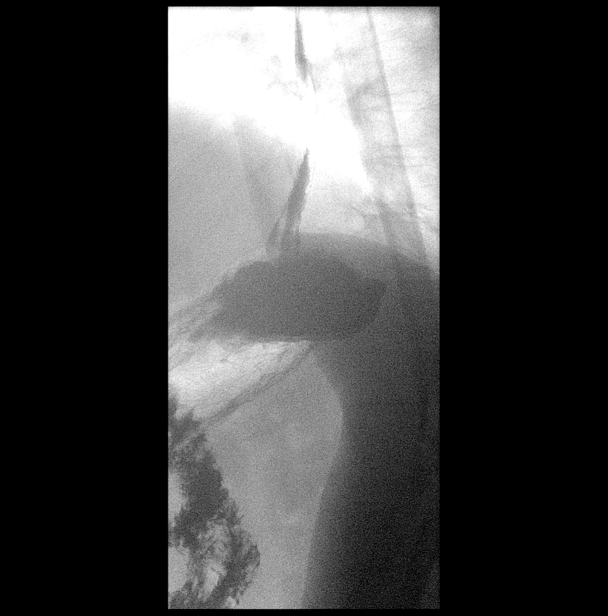

[Series 9: cp_standard · 0.29mm/px · 1 of 1 slices shown (7 of 8)]
[im 1/1]
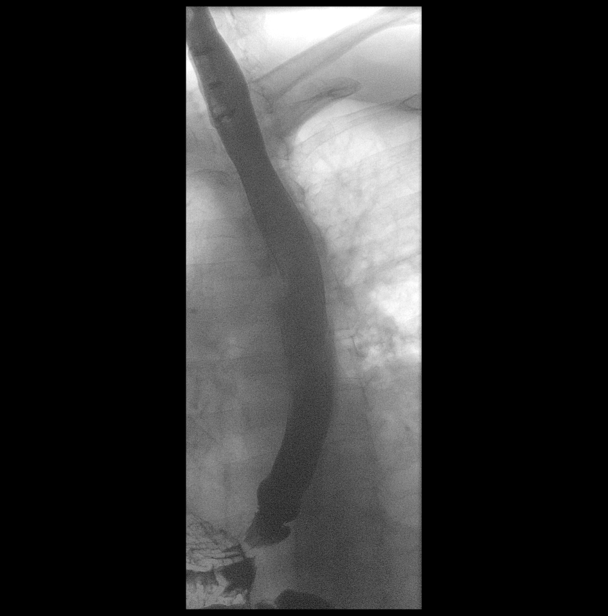

[Series 11: cp_standard · 0.29mm/px · 1 of 1 slices shown (8 of 8)]
[im 1/1]
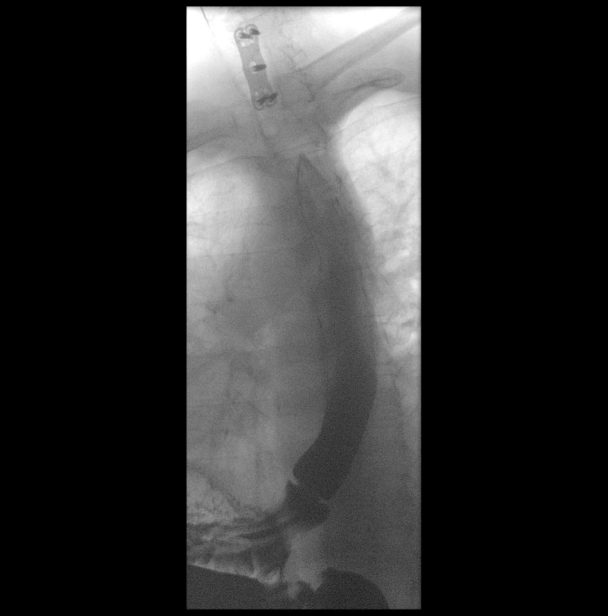

[14 of 23 positions shown; findings below may reference images not displayed]

FINDINGS: Normal pharyngeal anatomy and motility. Contrast flowed freely
through the esophagus without evidence of a stricture or mass.
Prominent Z line. Normal esophageal mucosa without evidence of
irregularity or ulceration. Single episode of tertiary contractions
of the distal third of the esophagus likely reflecting mild spasm.
Mild gastroesophageal reflux. No definite hiatal hernia was
demonstrated.

At the end of the examination a 13 mm barium tablet was administered
which transited through the esophagus and esophagogastric junction
without delay.
IMPRESSION: 1. Mild gastroesophageal reflux.
2. Single episode of tertiary contractions of the distal third of
the esophagus likely reflecting mild spasm.
3. No esophageal stricture to restrict the passage of a barium
tablet.

## 2021-03-03 ENCOUNTER — Other Ambulatory Visit: Payer: Self-pay

## 2021-03-03 ENCOUNTER — Ambulatory Visit
Admission: RE | Admit: 2021-03-03 | Discharge: 2021-03-03 | Disposition: A | Discharge status: Home / Self Care | Payer: Medicare Other | Source: Ambulatory Visit | Attending: Podiatry | Admitting: Podiatry

## 2021-03-03 DIAGNOSIS — M7662 Achilles tendinitis, left leg: Secondary | ICD-10-CM

## 2021-03-04 ENCOUNTER — Telehealth: Payer: Self-pay | Admitting: *Deleted

## 2021-03-04 DIAGNOSIS — M7662 Achilles tendinitis, left leg: Secondary | ICD-10-CM

## 2021-03-04 NOTE — Telephone Encounter (Signed)
-----   Message from Garrel Ridgel, [place] sent at 03/04/2021  7:33 AM EST ----- He needs to go to PT for this. Otherwise it will be surgical

## 2021-03-05 NOTE — Addendum Note (Signed)
Addended by: Rip Harbour on: 03/05/2021 04:46 PM   Modules accepted: Orders

## 2021-03-13 ENCOUNTER — Ambulatory Visit: Payer: Medicare Other | Attending: Podiatry

## 2021-03-13 DIAGNOSIS — M7662 Achilles tendinitis, left leg: Secondary | ICD-10-CM | POA: Insufficient documentation

## 2021-03-13 DIAGNOSIS — M7660 Achilles tendinitis, unspecified leg: Secondary | ICD-10-CM | POA: Diagnosis not present

## 2021-03-13 DIAGNOSIS — M79672 Pain in left foot: Secondary | ICD-10-CM | POA: Insufficient documentation

## 2021-03-13 NOTE — Patient Instructions (Signed)
Access Code: [email or web address] URL: [email or web address] Date: 03/13/2021 Prepared by: Joneen Boers  Exercises Long Sitting Eccentric Ankle Plantar Flexion with Resistance - 1 x daily - 7 x weekly - 1-3 sets - 10 reps

## 2021-03-13 NOTE — Therapy (Signed)
[place] [facility] [address], [place], [address] Phone: [phone]   Fax:  [phone]  Physical Therapy Evaluation  Patient Details  Name: Scott Skinner MRN: [id number] Date of Birth: 21-Aug-1950 Referring Provider (PT): [place], [place] T, [place]   Encounter Date: 03/13/2021   PT End of Session - 03/13/21 1013     Visit Number 1    Number of Visits 13    Date for PT Re-Evaluation 04/24/21    Authorization Type 1    Authorization Time Period 10    PT Start Time 1013    PT Stop Time 1102    PT Time Calculation (min) 49 min    Activity Tolerance Patient tolerated treatment well    Behavior During Therapy [facility] for tasks assessed/performed             Past Medical History:  Diagnosis Date   Cancer ([place])    prostate cancer    Chronic kidney disease    Complication of anesthesia    anaphylaxis    Diabetes mellitus without complication (HCC)    GERD (gastroesophageal reflux disease)    Gout    History of kidney stones    Hyperlipidemia    Hypertension    Microvascular ischemia of myocardium    Obesity    OSA (obstructive sleep apnea)    Prostate cancer [facility])     Past Surgical History:  Procedure Laterality Date   COLONOSCOPY WITH PROPOFOL N/A 10/05/2016   Procedure: COLONOSCOPY WITH PROPOFOL;  Surgeon: [name], MD;  Location: [facility] ENDOSCOPY;  Service: Endoscopy;  Laterality: N/A;   COLONOSCOPY WITH PROPOFOL N/A 10/04/2019   Procedure: COLONOSCOPY WITH PROPOFOL;  Surgeon: Toledo, [name], MD;  Location: [facility] ENDOSCOPY;  Service: Gastroenterology;  Laterality: N/A;   CYST REMOVAL TRUNK  1977   ESOPHAGOGASTRODUODENOSCOPY (EGD) WITH PROPOFOL N/A 10/04/2019   Procedure: ESOPHAGOGASTRODUODENOSCOPY (EGD) WITH PROPOFOL;  Surgeon: Toledo, [name], MD;  Location: [facility] ENDOSCOPY;  Service: Gastroenterology;  Laterality: N/A;   LEFT HEART CATH AND CORONARY ANGIOGRAPHY Left 05/17/2019   Procedure: LEFT  HEART CATH AND CORONARY ANGIOGRAPHY;  Surgeon: Corey Skains, MD;  Location: [facility] [facility];  Service: Cardiovascular;  Laterality: Left;   NECK SURGERY  2005   PROSTATE SURGERY      There were no vitals filed for this visit.    Subjective Assessment - 03/13/21 1019     Subjective L Achilles tendon: 0/10 currently (pt sitting) and when walking from waiting room to treatment room.  7/10 at worst for the past 2 months.    Pertinent History L Achilles Tendonitis. Pain began about 3 months ago. Gradually worsened. Had bone spurs in X-ray. MRI revealed no tears but a thickening of the tendon. Does not wear arch support. Also has a night boot and a CAM boot which have helped. Does not wear it if it rains. Also wears it occasionally. Wears CAM boot if he is going to be walking for a while.    Patient Stated Goals Stretch his Achilles out.    Currently in Pain? No/denies    Pain Location Ankle    Pain Orientation Left    Pain Descriptors / Indicators Aching;Sharp    Pain Type Acute pain    Pain Onset More than a month ago    Pain Frequency Occasional    Aggravating Factors  Being on his feet for about 6 hours. Usually stands stationary doing woodwork at his  bench/table. Stairs, going up an incline.    Pain Relieving Factors Non-weight bearing.                [facility] PT Assessment - 03/13/21 1018       Assessment   Medical Diagnosis M76.62 (ICD-10-CM) - Achilles tendinitis of left lower extremity    Referring Provider (PT) [place], Max T, [place]    Onset Date/Surgical Date 03/05/21   date PT referral signed   Prior Therapy None for current condition      Precautions   Precaution Comments possible fall risk      Restrictions   Other Position/Activity Restrictions No known restrictions      Balance Screen   Has the patient fallen in the past 6 months Yes    How many times? 2 times   Tripping   Has the patient had a decrease in activity level because of a fear of falling?  No     Is the patient reluctant to leave their home because of a fear of falling?  No      Home Environment   Additional Comments Pt lives in a 1 story home, 3 steps to enter front door, no rail      Observation/Other Assessments   Focus on Therapeutic Outcomes (FOTO)  L foot FOTO 67      Posture/Postural Control   Posture Comments forward neck, R hip ER > L, decreased lordosis, slight L lateral shift, Slight foot pronation      AROM   Right Ankle Dorsiflexion 4   9 degrees AAROM   Left Ankle Dorsiflexion 3   9 degrees AAROM     Strength   Right Hip Flexion 4+/5    Right Hip Extension 3+/5    Right Hip External Rotation  4/5    Right Hip ABduction 4/5    Left Hip Flexion 4/5    Left Hip Extension 4-/5    Left Hip External Rotation 4/5    Left Hip ABduction 4+/5    Right Knee Flexion 4+/5    Right Knee Extension 5/5    Left Knee Flexion 4+/5    Left Knee Extension 5/5    Right Ankle Dorsiflexion 5/5   supine, manually resisted   Right Ankle Plantar Flexion 4+/5   supine, manually resisted   Left Ankle Dorsiflexion 5/5   supine, manually resisted   Left Ankle Plantar Flexion 4+/5   supine, manually resisted     Palpation   Palpation comment TTP L Achilles insertion      Ambulation/Gait   Gait Comments Gait:  decreased stance R LE. Decreased B ankle DF, decreased heel strike                        Objective measurements completed on examination: See above findings.        DM and HTN are both controlled per pt    Medbridge Access Code [phone]  Manual therapy  STM L Achillles tendon and calcaneous to decrease soft tissue restrictions   Therapeutic exercise  Eccentric L ankle PF gray band 10x    Improved exercise technique, movement at target joints, use of target muscles after mod verbal, visual, tactile cues.    Response to treatment Fair tolerance today's session.   Clinical impression Pt is a 70 year old male who came to physical  therapy secondary to L Achilles pain. He also presents with decreased fascial mobility L calcaneous, limited L ankle  DF AROM, decreased bilateral hip strength, TTP, and difficulty performing tasks which requires prolonged standing as well as stair negotiation and ambulating inclines. Pt will benefit from skilled physical therapy to address the aforementioned deficits.                       PT Education - 03/13/21 1323     Education Details ther-ex, HEP    Person(s) Educated Patient    Methods Explanation;Demonstration;Tactile cues;Verbal cues;Handout    Comprehension Verbalized understanding;Returned demonstration              PT Short Term Goals - 03/13/21 1649       PT SHORT TERM GOAL #1   Title Pt will be independent with his initial HEP to decrease pain, and improve ability to perform standing tasks more comfortably.    Baseline Pt has started his initial HEP (03/13/2021)    Time 3    Period Weeks    Status New    Target Date 04/03/21               PT Long Term Goals - 03/13/21 1650       PT LONG TERM GOAL #1   Title Pt will have a decrease in L Achilled pain to 2/10 or less at worst to promote ability to ambulate, negotiate stairs, and perform standing tasks more comfortably.    Baseline 7/10 L achilles pain at worst for the past 2 months (03/13/2021)    Time 6    Period Weeks    Status New    Target Date 04/24/21      PT LONG TERM GOAL #2   Title Pt will improve B ankle DF AROM to at least 10 degrees to promote ability to ambulate and perform standing tasks more comfortably.    Baseline Ankle DF AROM 4 degrees R, 3 degrees L (03/13/2021)    Time 6    Period Weeks    Status New    Target Date 04/24/21      PT LONG TERM GOAL #3   Title Pt will improve his FOTO score by at least 10 points as a demonstration of improved function.    Baseline L foot FOTO 67 (03/13/2021)    Time 6    Period Weeks    Status New    Target Date 04/24/21                     Plan - 03/13/21 1322     Clinical Impression Statement Pt is a 70 year old male who came to physical therapy secondary to L Achilles pain. He also presents with decreased fascial mobility L calcaneous, limited L ankle DF AROM, decreased bilateral hip strength, TTP, and difficulty performing tasks which requires prolonged standing as well as stair negotiation and ambulating inclines. Pt will benefit from skilled physical therapy to address the aforementioned deficits.    Personal Factors and Comorbidities Age;Comorbidity 3+;Time since onset of injury/illness/exacerbation;Fitness    Comorbidities CA, DM, HTN, obesity    Examination-Activity Limitations Stairs;Locomotion Level;Stand    Stability/Clinical Decision Making Stable/Uncomplicated    Clinical Decision Making Low    Rehab Potential Fair    PT Frequency 2x / week    PT Duration 6 weeks    PT Treatment/Interventions Therapeutic activities;Therapeutic exercise;Neuromuscular re-education;Patient/family education;Manual techniques;Dry needling;Aquatic Therapy;Electrical Stimulation;Iontophoresis 4mg /ml Dexamethasone    PT Next Visit Plan isometric, concentric, eccentric loading, ankle and hip strength, manual techniques, modalities PRN  PT Home Exercise Plan Medbridge Access Code [email or web address]    [id number] and Agree with Plan of Care Patient             Patient will benefit from skilled therapeutic intervention in order to improve the following deficits and impairments:  Pain, Improper body mechanics, Difficulty walking, Decreased strength, Decreased range of motion  Visit Diagnosis: Pain in Achilles tendon - Plan: PT plan of care cert/re-cert  Pain in left foot - Plan: PT plan of care cert/re-cert     Problem List Patient Active Problem List   Diagnosis Date Noted   Personal history of gout 11/13/2020   History of prostate cancer 10/13/2019   CKD (chronic kidney disease) stage 3, GFR 30-59 ml/min ([place])  06/05/2019   Microvascular ischemia of myocardium 06/01/2019   Angina pectoris ([place]) 05/10/2019   Encounter for general adult medical examination without abnormal findings 11/28/2018   Other obesity due to excess calories 07/26/2018   OSA on CPAP 11/30/2017   History of OCD (obsessive compulsive disorder) 06/01/2016   Mixed hypercholesterolemia and hypertriglyceridemia 04/20/2016   GERD without esophagitis 01/13/2016   Attention deficit disorder without hyperactivity 11/12/2015   Hypertension 05/14/2015   Diabetes mellitus type 2, uncontrolled, without complications 24/81/8590   Hypercalcemia 05/14/2015   Abdominal pain, LLQ (left lower quadrant) 04/23/2015   ED (erectile dysfunction) of organic origin 10/21/2012   Incomplete bladder emptying 10/21/2012   Malignant neoplasm of prostate ([place]) 10/21/2012   Testicular hypofunction 10/21/2012   Bladder outlet obstruction 10/21/2012    Joneen Boers PT, DPT   03/13/2021, 5:02 PM  [place] [facility] [address] [place], [address] Phone: [phone]   Fax:  [phone]  Name: JAYLEE LANTRY MRN: [id number] Date of Birth: 1950/12/28

## 2021-03-19 ENCOUNTER — Ambulatory Visit: Payer: Medicare Other | Admitting: Podiatry

## 2021-03-24 ENCOUNTER — Ambulatory Visit: Payer: Medicare Other | Attending: Podiatry

## 2021-03-24 DIAGNOSIS — M7660 Achilles tendinitis, unspecified leg: Secondary | ICD-10-CM | POA: Insufficient documentation

## 2021-03-24 DIAGNOSIS — M79672 Pain in left foot: Secondary | ICD-10-CM | POA: Diagnosis present

## 2021-03-24 NOTE — Therapy (Signed)
[place] [facility] [address], [place], [address] Phone: [phone]   Fax:  [phone]  Physical Therapy Treatment  Patient Details  Name: Scott Skinner MRN: [id number] Date of Birth: [birth year] Referring Provider (PT): [place], [place] T, [place]   Encounter Date: 03/24/2021   PT End of Session - 03/24/21 0935     Visit Number 2    Number of Visits 13    Date for PT Re-Evaluation 04/24/21    Authorization Type 2    Authorization Time Period 10    PT Start Time 0935    PT Stop Time 1016    PT Time Calculation (min) 41 min    Activity Tolerance Patient tolerated treatment well    Behavior During Therapy [facility] for tasks assessed/performed             Past Medical History:  Diagnosis Date   Cancer ([place])    prostate cancer    Chronic kidney disease    Complication of anesthesia    anaphylaxis    Diabetes mellitus without complication (HCC)    GERD (gastroesophageal reflux disease)    Gout    History of kidney stones    Hyperlipidemia    Hypertension    Microvascular ischemia of myocardium    Obesity    OSA (obstructive sleep apnea)    Prostate cancer [facility])     Past Surgical History:  Procedure Laterality Date   COLONOSCOPY WITH PROPOFOL N/A 10/05/2016   Procedure: COLONOSCOPY WITH PROPOFOL;  Surgeon: [name], MD;  Location: [facility] ENDOSCOPY;  Service: Endoscopy;  Laterality: N/A;   COLONOSCOPY WITH PROPOFOL N/A 10/04/2019   Procedure: COLONOSCOPY WITH PROPOFOL;  Surgeon: Toledo, [name], MD;  Location: [facility] ENDOSCOPY;  Service: Gastroenterology;  Laterality: N/A;   CYST REMOVAL TRUNK  1977   ESOPHAGOGASTRODUODENOSCOPY (EGD) WITH PROPOFOL N/A 10/04/2019   Procedure: ESOPHAGOGASTRODUODENOSCOPY (EGD) WITH PROPOFOL;  Surgeon: Toledo, [name], MD;  Location: [facility] ENDOSCOPY;  Service: Gastroenterology;  Laterality: N/A;   LEFT HEART CATH AND CORONARY ANGIOGRAPHY Left 05/17/2019   Procedure: LEFT  HEART CATH AND CORONARY ANGIOGRAPHY;  Surgeon: Corey Skains, MD;  Location: [place] [facility];  Service: Cardiovascular;  Laterality: Left;   NECK SURGERY  2005   PROSTATE SURGERY      There were no vitals filed for this visit.   Subjective Assessment - 03/24/21 0937     Subjective L Achilles is off and on. Has been doing the HEP every night. Doing fine. Was on his Achilles Thursday and Friday all day, 6+ hours. Then wore his CAM boot a couple of times. Felt like L foot cramped up afterwards. No pain currently.    Pertinent History L Achilles Tendonitis. Pain began about 3 months ago. Gradually worsened. Had bone spurs in X-ray. MRI revealed no tears but a thickening of the tendon. Does not wear arch support. Also has a night boot and a CAM boot which have helped. Does not wear it if it rains. Also wears it occasionally. Wears CAM boot if he is going to be walking for a while.    Patient Stated Goals Stretch his Achilles out.    Currently in Pain? No/denies    Pain Onset More than a month ago  PT Education - 03/24/21 1009     Education Details ther-ex    [employer]) Educated Patient    Methods Explanation;Demonstration;Tactile cues;Verbal cues    Comprehension Returned demonstration;Verbalized understanding              Objective          DM and HTN are both controlled per pt       Medbridge Access Code [phone]   Manual therapy Seated STM L Achillles tendon and calcaneous to decrease soft tissue restrictions seated STM L plantar foot to decrease fascial restrictions      Therapeutic exercise   Standing heel raises with B UE assist 10x3  Standing gastroc stretch   L 30 seconds x 3  Standing Ankle DF/PF on rockerboard with B UE assist 2 minutes  Standing static mini lunge R UE assist   L 10x3  Forward step up onto Air Ex pad with one UE assist   L 10x    Improved exercise technique,  movement at target joints, use of target muscles after mod verbal, visual, tactile cues.      Response to treatment Fair tolerance today's session.    Clinical impression Worked on decreasing fascial restrictions to L heel and plantar foot as well as promoting proper stress to affected tissues to promote healing. Fair tolerance to today's session. Pt will benefit from continued skilled physical therapy services to decrease pain, improve strength and function.        PT Short Term Goals - 03/13/21 1649       PT SHORT TERM GOAL #1   Title Pt will be independent with his initial HEP to decrease pain, and improve ability to perform standing tasks more comfortably.    Baseline Pt has started his initial HEP (03/13/2021)    Time 3    Period Weeks    Status New    Target Date 04/03/21               PT Long Term Goals - 03/13/21 1650       PT LONG TERM GOAL #1   Title Pt will have a decrease in L Achilled pain to 2/10 or less at worst to promote ability to ambulate, negotiate stairs, and perform standing tasks more comfortably.    Baseline 7/10 L achilles pain at worst for the past 2 months (03/13/2021)    Time 6    Period Weeks    Status New    Target Date 04/24/21      PT LONG TERM GOAL #2   Title Pt will improve B ankle DF AROM to at least 10 degrees to promote ability to ambulate and perform standing tasks more comfortably.    Baseline Ankle DF AROM 4 degrees R, 3 degrees L (03/13/2021)    Time 6    Period Weeks    Status New    Target Date 04/24/21      PT LONG TERM GOAL #3   Title Pt will improve his FOTO score by at least 10 points as a demonstration of improved function.    Baseline L foot FOTO 67 (03/13/2021)    Time 6    Period Weeks    Status New    Target Date 04/24/21                   Plan - 03/24/21 1802     Clinical Impression Statement Worked on decreasing fascial restrictions to L heel and plantar foot as  well as promoting proper stress  to affected tissues to promote healing. Fair tolerance to today's session. Pt will benefit from continued skilled physical therapy services to decrease pain, improve strength and function.    Personal Factors and Comorbidities Age;Comorbidity 3+;Time since onset of injury/illness/exacerbation;Fitness    Comorbidities CA, DM, HTN, obesity    Examination-Activity Limitations Stairs;Locomotion Level;Stand    Stability/Clinical Decision Making Stable/Uncomplicated    Rehab Potential Fair    PT Frequency 2x / week    PT Duration 6 weeks    PT Treatment/Interventions Therapeutic activities;Therapeutic exercise;Neuromuscular re-education;Patient/family education;Manual techniques;Dry needling;Aquatic Therapy;Electrical Stimulation;Iontophoresis 4mg /ml Dexamethasone    PT Next Visit Plan isometric, concentric, eccentric loading, ankle and hip strength, manual techniques, modalities PRN    PT Home Exercise Plan Medbridge Access Code [email or web address]    [id number] and Agree with Plan of Care Patient             Patient will benefit from skilled therapeutic intervention in order to improve the following deficits and impairments:  Pain, Improper body mechanics, Difficulty walking, Decreased strength, Decreased range of motion  Visit Diagnosis: Pain in Achilles tendon  Pain in left foot     Problem List Patient Active Problem List   Diagnosis Date Noted   Personal history of gout 11/13/2020   History of prostate cancer 10/13/2019   CKD (chronic kidney disease) stage 3, GFR 30-59 ml/min ([place]) 06/05/2019   Microvascular ischemia of myocardium 06/01/2019   Angina pectoris ([place]) 05/10/2019   Encounter for general adult medical examination without abnormal findings 11/28/2018   Other obesity due to excess calories 07/26/2018   OSA on CPAP 11/30/2017   History of OCD (obsessive compulsive disorder) 06/01/2016   Mixed hypercholesterolemia and hypertriglyceridemia 04/20/2016   GERD without esophagitis  01/13/2016   Attention deficit disorder without hyperactivity 11/12/2015   Hypertension 05/14/2015   Diabetes mellitus type 2, uncontrolled, without complications 16/96/7893   Hypercalcemia 05/14/2015   Abdominal pain, LLQ (left lower quadrant) 04/23/2015   ED (erectile dysfunction) of organic origin 10/21/2012   Incomplete bladder emptying 10/21/2012   Malignant neoplasm of prostate ([place]) 10/21/2012   Testicular hypofunction 10/21/2012   Bladder outlet obstruction 10/21/2012   Joneen Boers PT, DPT   03/24/2021, 6:06 PM  [place] [facility] [address], [place], [address] Phone: [phone]   Fax:  [phone]  Name: Scott Skinner MRN: [id number] Date of Birth: 1950/06/15

## 2021-03-26 ENCOUNTER — Ambulatory Visit: Payer: Medicare Other

## 2021-03-26 DIAGNOSIS — M7660 Achilles tendinitis, unspecified leg: Secondary | ICD-10-CM

## 2021-03-26 DIAGNOSIS — M79672 Pain in left foot: Secondary | ICD-10-CM

## 2021-03-26 NOTE — Therapy (Signed)
[place] [facility] [address], [place], [address] Phone: [phone]   Fax:  [phone]  Physical Therapy Treatment  Patient Details  Name: Scott Skinner MRN: [id number] Date of Birth: 01/05/1951 Referring Provider (PT): [place], [place] T, [place]   Encounter Date: 03/26/2021   PT End of Session - 03/26/21 1059     Visit Number 3    Number of Visits 13    Date for PT Re-Evaluation 04/24/21    Authorization Type 3    Authorization Time Period 10    PT Start Time 1102    PT Stop Time 1141    PT Time Calculation (min) 39 min    Activity Tolerance Patient tolerated treatment well    Behavior During Therapy [facility] for tasks assessed/performed             Past Medical History:  Diagnosis Date   Cancer ([place])    prostate cancer    Chronic kidney disease    Complication of anesthesia    anaphylaxis    Diabetes mellitus without complication (HCC)    GERD (gastroesophageal reflux disease)    Gout    History of kidney stones    Hyperlipidemia    Hypertension    Microvascular ischemia of myocardium    Obesity    OSA (obstructive sleep apnea)    Prostate cancer [facility])     Past Surgical History:  Procedure Laterality Date   COLONOSCOPY WITH PROPOFOL N/A 10/05/2016   Procedure: COLONOSCOPY WITH PROPOFOL;  Surgeon: [name], MD;  Location: [facility] ENDOSCOPY;  Service: Endoscopy;  Laterality: N/A;   COLONOSCOPY WITH PROPOFOL N/A 10/04/2019   Procedure: COLONOSCOPY WITH PROPOFOL;  Surgeon: Toledo, [name], MD;  Location: [facility] ENDOSCOPY;  Service: Gastroenterology;  Laterality: N/A;   CYST REMOVAL TRUNK  1977   ESOPHAGOGASTRODUODENOSCOPY (EGD) WITH PROPOFOL N/A 10/04/2019   Procedure: ESOPHAGOGASTRODUODENOSCOPY (EGD) WITH PROPOFOL;  Surgeon: Toledo, [name], MD;  Location: [facility] ENDOSCOPY;  Service: Gastroenterology;  Laterality: N/A;   LEFT HEART CATH AND CORONARY ANGIOGRAPHY Left 05/17/2019   Procedure: LEFT  HEART CATH AND CORONARY ANGIOGRAPHY;  Surgeon: Corey Skains, MD;  Location: [place] [facility];  Service: Cardiovascular;  Laterality: Left;   NECK SURGERY  2005   PROSTATE SURGERY      There were no vitals filed for this visit.   Subjective Assessment - 03/26/21 1102     Subjective L heel was ok yesterday. Did the HEP the other night. Left the gym earlier. A little sensitive right now. Was ok last session.    Pertinent History L Achilles Tendonitis. Pain began about 3 months ago. Gradually worsened. Had bone spurs in X-ray. MRI revealed no tears but a thickening of the tendon. Does not wear arch support. Also has a night boot and a CAM boot which have helped. Does not wear it if it rains. Also wears it occasionally. Wears CAM boot if he is going to be walking for a while.    Patient Stated Goals Stretch his Achilles out.    Currently in Pain? No/denies    Pain Onset More than a month ago                                        PT Education - 03/26/21 1135     Education Details ther-ex    [employer]) Educated  Patient    Methods Explanation;Demonstration;Tactile cues;Verbal cues    Comprehension Returned demonstration;Verbalized understanding            Objective          DM and HTN are both controlled per pt       Medbridge Access Code [phone]   Manual therapy  Prone STM with and without using EDGE tool L Achillles tendon and calcaneous to decrease soft tissue restrictions  Prone STM L plantar foot to decrease fascial restrictions        Therapeutic exercise   Standing gastroc stretch              L 30 seconds x 3   Standing static mini lunge R UE assist              L 10x3  Standing heel raises with B UE assist 10x3      Improved exercise technique, movement at target joints, use of target muscles after mod verbal, visual, tactile cues.      Response to treatment Fair tolerance today's session.    Clinical  impression Continued working on decreasing fascial restrictions to L heel and plantar foot as well as promoting blood flow and proper stress to affected tissues to promote healing. Fair tolerance to today's session. Pt will benefit from continued skilled physical therapy services to decrease pain, improve strength and function.       PT Short Term Goals - 03/13/21 1649       PT SHORT TERM GOAL #1   Title Pt will be independent with his initial HEP to decrease pain, and improve ability to perform standing tasks more comfortably.    Baseline Pt has started his initial HEP (03/13/2021)    Time 3    Period Weeks    Status New    Target Date 04/03/21               PT Long Term Goals - 03/13/21 1650       PT LONG TERM GOAL #1   Title Pt will have a decrease in L Achilled pain to 2/10 or less at worst to promote ability to ambulate, negotiate stairs, and perform standing tasks more comfortably.    Baseline 7/10 L achilles pain at worst for the past 2 months (03/13/2021)    Time 6    Period Weeks    Status New    Target Date 04/24/21      PT LONG TERM GOAL #2   Title Pt will improve B ankle DF AROM to at least 10 degrees to promote ability to ambulate and perform standing tasks more comfortably.    Baseline Ankle DF AROM 4 degrees R, 3 degrees L (03/13/2021)    Time 6    Period Weeks    Status New    Target Date 04/24/21      PT LONG TERM GOAL #3   Title Pt will improve his FOTO score by at least 10 points as a demonstration of improved function.    Baseline L foot FOTO 67 (03/13/2021)    Time 6    Period Weeks    Status New    Target Date 04/24/21                   Plan - 03/26/21 1056     Clinical Impression Statement Continued working on decreasing fascial restrictions to L heel and plantar foot as well as promoting blood flow and proper stress  to affected tissues to promote healing. Fair tolerance to today's session. Pt will benefit from continued skilled  physical therapy services to decrease pain, improve strength and function.    Personal Factors and Comorbidities Age;Comorbidity 3+;Time since onset of injury/illness/exacerbation;Fitness    Comorbidities CA, DM, HTN, obesity    Examination-Activity Limitations Stairs;Locomotion Level;Stand    Stability/Clinical Decision Making Stable/Uncomplicated    Rehab Potential Fair    PT Frequency 2x / week    PT Duration 6 weeks    PT Treatment/Interventions Therapeutic activities;Therapeutic exercise;Neuromuscular re-education;Patient/family education;Manual techniques;Dry needling;Aquatic Therapy;Electrical Stimulation;Iontophoresis 4mg /ml Dexamethasone    PT Next Visit Plan isometric, concentric, eccentric loading, ankle and hip strength, manual techniques, modalities PRN    PT Home Exercise Plan Medbridge Access Code [email or web address]    [id number] and Agree with Plan of Care Patient             Patient will benefit from skilled therapeutic intervention in order to improve the following deficits and impairments:  Pain, Improper body mechanics, Difficulty walking, Decreased strength, Decreased range of motion  Visit Diagnosis: Pain in Achilles tendon  Pain in left foot     Problem List Patient Active Problem List   Diagnosis Date Noted   Personal history of gout 11/13/2020   History of prostate cancer 10/13/2019   CKD (chronic kidney disease) stage 3, GFR 30-59 ml/min ([place]) 06/05/2019   Microvascular ischemia of myocardium 06/01/2019   Angina pectoris ([place]) 05/10/2019   Encounter for general adult medical examination without abnormal findings 11/28/2018   Other obesity due to excess calories 07/26/2018   OSA on CPAP 11/30/2017   History of OCD (obsessive compulsive disorder) 06/01/2016   Mixed hypercholesterolemia and hypertriglyceridemia 04/20/2016   GERD without esophagitis 01/13/2016   Attention deficit disorder without hyperactivity 11/12/2015   Hypertension 05/14/2015   Diabetes  mellitus type 2, uncontrolled, without complications 38/33/3832   Hypercalcemia 05/14/2015   Abdominal pain, LLQ (left lower quadrant) 04/23/2015   ED (erectile dysfunction) of organic origin 10/21/2012   Incomplete bladder emptying 10/21/2012   Malignant neoplasm of prostate ([place]) 10/21/2012   Testicular hypofunction 10/21/2012   Bladder outlet obstruction 10/21/2012    Joneen Boers PT, DPT  03/26/2021, 11:52 AM  [place] [facility] [address], [place], [address] Phone: [phone]   Fax:  [phone]  Name: Scott Skinner MRN: [id number] Date of Birth: [birth year]

## 2021-03-31 ENCOUNTER — Ambulatory Visit: Payer: Medicare Other

## 2021-03-31 ENCOUNTER — Encounter: Payer: Self-pay | Admitting: Podiatry

## 2021-03-31 ENCOUNTER — Ambulatory Visit (INDEPENDENT_AMBULATORY_CARE_PROVIDER_SITE_OTHER): Payer: Medicare Other | Admitting: Podiatry

## 2021-03-31 ENCOUNTER — Other Ambulatory Visit: Payer: Self-pay

## 2021-03-31 DIAGNOSIS — S86012D Strain of left Achilles tendon, subsequent encounter: Secondary | ICD-10-CM

## 2021-03-31 DIAGNOSIS — L603 Nail dystrophy: Secondary | ICD-10-CM | POA: Diagnosis not present

## 2021-03-31 MED ORDER — TERBINAFINE HCL 250 MG PO TABS: 250.0000 mg | ORAL_TABLET | Freq: Every day | ORAL | 0 refills | Status: DC

## 2021-03-31 NOTE — Progress Notes (Signed)
He presents today for follow-up of his Achilles tendinitis and his MRI which does not demonstrates Achilles tendinitis without tear.  He is also here for his pathology result.  Objective: Vital signs are stable alert and oriented x3.  Pulses are palpable.  No change in the hallux nail left.  He still has tenderness on palpation of the Achilles.  Assessment: Onychomycosis per pathology Achilles tendinitis.  Plan: Continue physical therapy for the Achilles tendinitis I also started him on Lamisil after discussing the pros and cons of the medication and possible side effects he understands there will require blood work as well.  I will start him on 30 days of Lamisil and we will follow-up with blood work in 1 month she has questions or concerns regarding the Achilles tendinitis he will notify me.

## 2021-04-03 ENCOUNTER — Ambulatory Visit: Payer: Medicare Other

## 2021-04-03 DIAGNOSIS — M7660 Achilles tendinitis, unspecified leg: Secondary | ICD-10-CM | POA: Diagnosis not present

## 2021-04-03 DIAGNOSIS — M79672 Pain in left foot: Secondary | ICD-10-CM

## 2021-04-03 NOTE — Therapy (Signed)
[facility] [address], [place], [address] Phone: [phone]   Fax:  [phone]  Physical Therapy Treatment  Patient Details  Name: Scott Skinner MRN: [id number] Date of Birth: 07-06-1950 Referring Provider (PT): [place], [place] T, [place]   Encounter Date: 04/03/2021   PT End of Session - 04/03/21 0936     Visit Number 4    Number of Visits 13    Date for PT Re-Evaluation 04/24/21    Authorization Type 4    Authorization Time Period 10    PT Start Time [phone]    PT Stop Time 1011    PT Time Calculation (min) 35 min    Activity Tolerance Patient tolerated treatment well    Behavior During Therapy [facility] for tasks assessed/performed             Past Medical History:  Diagnosis Date   Cancer ([place])    prostate cancer    Chronic kidney disease    Complication of anesthesia    anaphylaxis    Diabetes mellitus without complication (HCC)    GERD (gastroesophageal reflux disease)    Gout    History of kidney stones    Hyperlipidemia    Hypertension    Microvascular ischemia of myocardium    Obesity    OSA (obstructive sleep apnea)    Prostate cancer ([place])     Past Surgical History:  Procedure Laterality Date   COLONOSCOPY WITH PROPOFOL N/A 10/05/2016   Procedure: COLONOSCOPY WITH PROPOFOL;  Surgeon: [name], MD;  Location: [facility] ENDOSCOPY;  Service: Endoscopy;  Laterality: N/A;   COLONOSCOPY WITH PROPOFOL N/A 10/04/2019   Procedure: COLONOSCOPY WITH PROPOFOL;  Surgeon: Toledo, [name], MD;  Location: [facility] ENDOSCOPY;  Service: Gastroenterology;  Laterality: N/A;   CYST REMOVAL TRUNK  1977   ESOPHAGOGASTRODUODENOSCOPY (EGD) WITH PROPOFOL N/A 10/04/2019   Procedure: ESOPHAGOGASTRODUODENOSCOPY (EGD) WITH PROPOFOL;  Surgeon: Toledo, [name], MD;  Location: [facility] ENDOSCOPY;  Service: Gastroenterology;  Laterality: N/A;   LEFT HEART CATH AND CORONARY ANGIOGRAPHY Left 05/17/2019   Procedure: LEFT  HEART CATH AND CORONARY ANGIOGRAPHY;  Surgeon: Corey Skains, MD;  Location: [place] CV LAB;  Service: Cardiovascular;  Laterality: Left;   NECK SURGERY  2005   PROSTATE SURGERY      There were no vitals filed for this visit.   Subjective Assessment - 04/03/21 0937     Subjective Saw the podiatrist. The MRI did not show any tearing but some healed, thus causing the deposit on the tendon. There is a bone spur on the bottom. The problem though is the build up on the tendon. Does not really feel like it is getting better, the therapy helps but has not gone away yet. 4/10 currently. Has been standing for 6 hours for the past few days.    Pertinent History L Achilles Tendonitis. Pain began about 3 months ago. Gradually worsened. Had bone spurs in X-ray. MRI revealed no tears but a thickening of the tendon. Does not wear arch support. Also has a night boot and a CAM boot which have helped. Does not wear it if it rains. Also wears it occasionally. Wears CAM boot if he is going to be walking for a while.    Patient Stated Goals Stretch his Achilles out.    Currently in Pain? Yes    Pain Score 4     Pain Onset More than a month ago  PT Education - 04/03/21 1116     Education Details ther-ex    Person(s) Educated Patient    Methods Explanation;Demonstration;Tactile cues;Verbal cues    Comprehension Returned demonstration;Verbalized understanding             Objective          DM and HTN are both controlled per pt       Medbridge Access Code [phone]   Manual therapy Prone STM with and without using EDGE tool L Achillles tendon and calcaneous to decrease soft tissue restrictions   Prone STM L plantar foot to decrease fascial restrictions        Therapeutic exercise    Pt currently wearing a CAM boot L foot.   Prone manually resisted eccentric PF 10x3     Improved exercise technique, movement at  target joints, use of target muscles after mod verbal, visual, tactile cues.      Response to treatment Decreased pain reported during gait after session    Clinical impression  Worked on decreasing fascial restrictions to L achilles tendon and heel using edge tool and plantar foot as well as promoting blood flow and proper stress to affected tissues to promote healing through muscle contraction. Decreased pain with gait reported after session. Pt will benefit from continued skilled physical therapy services to decrease pain, improve strength and function.      PT Short Term Goals - 03/13/21 1649       PT SHORT TERM GOAL #1   Title Pt will be independent with his initial HEP to decrease pain, and improve ability to perform standing tasks more comfortably.    Baseline Pt has started his initial HEP (03/13/2021)    Time 3    Period Weeks    Status New    Target Date 04/03/21               PT Long Term Goals - 03/13/21 1650       PT LONG TERM GOAL #1   Title Pt will have a decrease in L Achilled pain to 2/10 or less at worst to promote ability to ambulate, negotiate stairs, and perform standing tasks more comfortably.    Baseline 7/10 L achilles pain at worst for the past 2 months (03/13/2021)    Time 6    Period Weeks    Status New    Target Date 04/24/21      PT LONG TERM GOAL #2   Title Pt will improve B ankle DF AROM to at least 10 degrees to promote ability to ambulate and perform standing tasks more comfortably.    Baseline Ankle DF AROM 4 degrees R, 3 degrees L (03/13/2021)    Time 6    Period Weeks    Status New    Target Date 04/24/21      PT LONG TERM GOAL #3   Title Pt will improve his FOTO score by at least 10 points as a demonstration of improved function.    Baseline L foot FOTO 67 (03/13/2021)    Time 6    Period Weeks    Status New    Target Date 04/24/21                   Plan - 04/03/21 1014     Clinical Impression Statement Worked  on decreasing fascial restrictions to L achilles tendon and heel using edge tool and plantar foot as well as promoting blood flow and proper stress to  affected tissues to promote healing through muscle contraction. Decreased pain with gait reported after session. Pt will benefit from continued skilled physical therapy services to decrease pain, improve strength and function.    Personal Factors and Comorbidities Age;Comorbidity 3+;Time since onset of injury/illness/exacerbation;Fitness    Comorbidities CA, DM, HTN, obesity    Examination-Activity Limitations Stairs;Locomotion Level;Stand    Stability/Clinical Decision Making Stable/Uncomplicated    Rehab Potential Fair    PT Frequency 2x / week    PT Duration 6 weeks    PT Treatment/Interventions Therapeutic activities;Therapeutic exercise;Neuromuscular re-education;Patient/family education;Manual techniques;Dry needling;Aquatic Therapy;Electrical Stimulation;Iontophoresis 4mg /ml Dexamethasone    PT Next Visit Plan isometric, concentric, eccentric loading, ankle and hip strength, manual techniques, modalities PRN    PT Home Exercise Plan Medbridge Access Code [email or web address]    [id number] and Agree with Plan of Care Patient             Patient will benefit from skilled therapeutic intervention in order to improve the following deficits and impairments:  Pain, Improper body mechanics, Difficulty walking, Decreased strength, Decreased range of motion  Visit Diagnosis: Pain in Achilles tendon  Pain in left foot     Problem List Patient Active Problem List   Diagnosis Date Noted   Personal history of gout 11/13/2020   History of prostate cancer 10/13/2019   CKD (chronic kidney disease) stage 3, GFR 30-59 ml/min ([place]) 06/05/2019   Microvascular ischemia of myocardium 06/01/2019   Angina pectoris ([place]) 05/10/2019   Encounter for general adult medical examination without abnormal findings 11/28/2018   Other obesity due to excess calories  07/26/2018   OSA on CPAP 11/30/2017   History of OCD (obsessive compulsive disorder) 06/01/2016   Mixed hypercholesterolemia and hypertriglyceridemia 04/20/2016   GERD without esophagitis 01/13/2016   Attention deficit disorder without hyperactivity 11/12/2015   Hypertension 05/14/2015   Diabetes mellitus type 2, uncontrolled, without complications 66/44/0347   Hypercalcemia 05/14/2015   Abdominal pain, LLQ (left lower quadrant) 04/23/2015   ED (erectile dysfunction) of organic origin 10/21/2012   Incomplete bladder emptying 10/21/2012   Malignant neoplasm of prostate ([place]) 10/21/2012   Testicular hypofunction 10/21/2012   Bladder outlet obstruction 10/21/2012    Joneen Boers PT, DPT   04/03/2021, 11:18 AM  [facility] [place] [facility] [address], [place], [address] Phone: [phone]   Fax:  [phone]  Name: Scott Skinner MRN: [id number] Date of Birth: 06-Jun-1950

## 2021-04-07 ENCOUNTER — Ambulatory Visit: Payer: Medicare Other

## 2021-04-08 ENCOUNTER — Ambulatory Visit: Payer: Medicare Other

## 2021-04-08 DIAGNOSIS — M7660 Achilles tendinitis, unspecified leg: Secondary | ICD-10-CM

## 2021-04-08 DIAGNOSIS — M79672 Pain in left foot: Secondary | ICD-10-CM

## 2021-04-08 NOTE — Therapy (Signed)
[facility] [address], [place], [address] Phone: [phone]   Fax:  [phone]  Physical Therapy Treatment  Patient Details  Name: Scott Skinner MRN: [id number] Date of Birth: Jan 08, 1951 Referring Provider (PT): [place], [place] T, [place]   Encounter Date: 04/08/2021   PT End of Session - 04/08/21 0825     Visit Number 5    Number of Visits 13    Date for PT Re-Evaluation 04/24/21    Authorization Type 5    Authorization Time Period 10    PT Start Time 0800    PT Stop Time 0845    PT Time Calculation (min) 45 min    Activity Tolerance Patient tolerated treatment well    Behavior During Therapy [facility] for tasks assessed/performed             Past Medical History:  Diagnosis Date   Cancer ([place])    prostate cancer    Chronic kidney disease    Complication of anesthesia    anaphylaxis    Diabetes mellitus without complication (HCC)    GERD (gastroesophageal reflux disease)    Gout    History of kidney stones    Hyperlipidemia    Hypertension    Microvascular ischemia of myocardium    Obesity    OSA (obstructive sleep apnea)    Prostate cancer [facility])     Past Surgical History:  Procedure Laterality Date   COLONOSCOPY WITH PROPOFOL N/A 10/05/2016   Procedure: COLONOSCOPY WITH PROPOFOL;  Surgeon: [name], MD;  Location: [facility] ENDOSCOPY;  Service: Endoscopy;  Laterality: N/A;   COLONOSCOPY WITH PROPOFOL N/A 10/04/2019   Procedure: COLONOSCOPY WITH PROPOFOL;  Surgeon: Toledo, [name], MD;  Location: [facility] ENDOSCOPY;  Service: Gastroenterology;  Laterality: N/A;   CYST REMOVAL TRUNK  1977   ESOPHAGOGASTRODUODENOSCOPY (EGD) WITH PROPOFOL N/A 10/04/2019   Procedure: ESOPHAGOGASTRODUODENOSCOPY (EGD) WITH PROPOFOL;  Surgeon: Toledo, [name], MD;  Location: [facility] ENDOSCOPY;  Service: Gastroenterology;  Laterality: N/A;   LEFT HEART CATH AND CORONARY ANGIOGRAPHY Left 05/17/2019   Procedure: LEFT  HEART CATH AND CORONARY ANGIOGRAPHY;  Surgeon: Corey Skains, MD;  Location: [place] CV LAB;  Service: Cardiovascular;  Laterality: Left;   NECK SURGERY  2005   PROSTATE SURGERY      There were no vitals filed for this visit.   Subjective Assessment - 04/08/21 0800     Subjective Pt reporting tenderness in L foot, wearing CAM boot. Repotrs prolonged standing yesterday. However no pain. Reports being compliant with HEP.    Pertinent History L Achilles Tendonitis. Pain began about 3 months ago. Gradually worsened. Had bone spurs in X-ray. MRI revealed no tears but a thickening of the tendon. Does not wear arch support. Also has a night boot and a CAM boot which have helped. Does not wear it if it rains. Also wears it occasionally. Wears CAM boot if he is going to be walking for a while.    Patient Stated Goals Stretch his Achilles out.    Currently in Pain? No/denies    Pain Score 0-No pain    Pain Onset More than a month ago             Manual therapy Prone STM with and without using EDGE tool L Achillles tendon and calcaneous to decrease soft tissue restrictions: 15 minutes   Prone STM L plantar foot to decrease fascial restrictions: 5 minutes   Noted R calcaneal  inversion. R side lying L foot subtalar inversion mobs: Grade 3 mobs, 5x15 sec bouts    There.ex:   Seated gastroc stretch with belt: 3x30 sec   Standing L eccentric heel raise/lowering with BUE support: 2x15  Great toe stretch seated edge of mat table: 10 with 10 sec holds  Towel curls with L foot for intrinsic foot strengthening/arch support: 2 towel lengths  LLE single heel lowering for eccentric strengthening to L achilles with BUE support on surface: 2x8 reps. Poor eccentric control noted.   Seated resisted ankle inversion and eversion with RTB: x15/direciton. Requires max TC's on knee to prevent rotary compensation.   PT Education - 04/08/21 0825     Education Details form/technique with exercise     Person(s) Educated Patient    Methods Explanation;Demonstration;Tactile cues;Verbal cues    Comprehension Verbalized understanding;Returned demonstration              PT Short Term Goals - 03/13/21 1649       PT SHORT TERM GOAL #1   Title Pt will be independent with his initial HEP to decrease pain, and improve ability to perform standing tasks more comfortably.    Baseline Pt has started his initial HEP (03/13/2021)    Time 3    Period Weeks    Status New    Target Date 04/03/21               PT Long Term Goals - 03/13/21 1650       PT LONG TERM GOAL #1   Title Pt will have a decrease in L Achilled pain to 2/10 or less at worst to promote ability to ambulate, negotiate stairs, and perform standing tasks more comfortably.    Baseline 7/10 L achilles pain at worst for the past 2 months (03/13/2021)    Time 6    Period Weeks    Status New    Target Date 04/24/21      PT LONG TERM GOAL #2   Title Pt will improve B ankle DF AROM to at least 10 degrees to promote ability to ambulate and perform standing tasks more comfortably.    Baseline Ankle DF AROM 4 degrees R, 3 degrees L (03/13/2021)    Time 6    Period Weeks    Status New    Target Date 04/24/21      PT LONG TERM GOAL #3   Title Pt will improve his FOTO score by at least 10 points as a demonstration of improved function.    Baseline L foot FOTO 67 (03/13/2021)    Time 6    Period Weeks    Status New    Target Date 04/24/21                   Plan - 04/08/21 0826     Clinical Impression Statement Following primary PT POC with focus on STM for improving fascial restrictions with ankle mobility/therex. Introduced intrinsic foot msucle strengthening and SLE eccentric descent tolerating without increase in pain. Pt will continue to benefit from skilled PT to progress ankle strength/mobility and decrease pain with standing and walking ADL's.    Personal Factors and Comorbidities Age;Comorbidity 3+;Time  since onset of injury/illness/exacerbation;Fitness    Comorbidities CA, DM, HTN, obesity    Examination-Activity Limitations Stairs;Locomotion Level;Stand    Stability/Clinical Decision Making Stable/Uncomplicated    Clinical Decision Making Low    Rehab Potential Fair    PT Frequency 2x / week  PT Duration 6 weeks    PT Treatment/Interventions Therapeutic activities;Therapeutic exercise;Neuromuscular re-education;Patient/family education;Manual techniques;Dry needling;Aquatic Therapy;Electrical Stimulation;Iontophoresis 4mg /ml Dexamethasone    PT Next Visit Plan isometric, concentric, eccentric loading, ankle and hip strength, manual techniques, modalities PRN    PT Home Exercise Plan Medbridge Access Code [email or web address]    [id number] and Agree with Plan of Care Patient             Patient will benefit from skilled therapeutic intervention in order to improve the following deficits and impairments:  Pain, Improper body mechanics, Difficulty walking, Decreased strength, Decreased range of motion  Visit Diagnosis: Pain in Achilles tendon  Pain in left foot     Problem List Patient Active Problem List   Diagnosis Date Noted   Personal history of gout 11/13/2020   History of prostate cancer 10/13/2019   CKD (chronic kidney disease) stage 3, GFR 30-59 ml/min ([place]) 06/05/2019   Microvascular ischemia of myocardium 06/01/2019   Angina pectoris ([place]) 05/10/2019   Encounter for general adult medical examination without abnormal findings 11/28/2018   Other obesity due to excess calories 07/26/2018   OSA on CPAP 11/30/2017   History of OCD (obsessive compulsive disorder) 06/01/2016   Mixed hypercholesterolemia and hypertriglyceridemia 04/20/2016   GERD without esophagitis 01/13/2016   Attention deficit disorder without hyperactivity 11/12/2015   Hypertension 05/14/2015   Diabetes mellitus type 2, uncontrolled, without complications 16/55/3748   Hypercalcemia 05/14/2015   Abdominal  pain, LLQ (left lower quadrant) 04/23/2015   ED (erectile dysfunction) of organic origin 10/21/2012   Incomplete bladder emptying 10/21/2012   Malignant neoplasm of prostate ([place]) 10/21/2012   Testicular hypofunction 10/21/2012   Bladder outlet obstruction 10/21/2012    Salem Caster. Fairly IV, PT, DPT Physical Therapist- [facility]  04/08/2021, 9:06 AM  [place] [facility] [address], [place], [address] Phone: [phone]   Fax:  [phone]  Name: Scott Skinner MRN: [id number] Date of Birth: [birth year]

## 2021-04-10 ENCOUNTER — Ambulatory Visit: Payer: Medicare Other

## 2021-04-10 ENCOUNTER — Other Ambulatory Visit: Payer: Self-pay

## 2021-04-10 DIAGNOSIS — M7660 Achilles tendinitis, unspecified leg: Secondary | ICD-10-CM | POA: Diagnosis not present

## 2021-04-10 DIAGNOSIS — M79672 Pain in left foot: Secondary | ICD-10-CM

## 2021-04-10 NOTE — Therapy (Signed)
[place] [facility] [address], [place], [address] Phone: [phone]   Fax:  [phone]  Physical Therapy Treatment  Patient Details  Name: Scott Skinner MRN: [id number] Date of Birth: 02-03-1951 Referring Provider (PT): [place], [place], [place]   Encounter Date: 04/10/2021   PT End of Session - 04/10/21 1137     Visit Number 6    Number of Visits 13    Date for PT Re-Evaluation 04/24/21    Authorization Type 6    Authorization Time Period 10    PT Start Time 1100    PT Stop Time 1150    PT Time Calculation (min) 50 min    Activity Tolerance Patient tolerated treatment well    Behavior During Therapy [facility] for tasks assessed/performed             Past Medical History:  Diagnosis Date   Cancer ([place])    prostate cancer    Chronic kidney disease    Complication of anesthesia    anaphylaxis    Diabetes mellitus without complication (HCC)    GERD (gastroesophageal reflux disease)    Gout    History of kidney stones    Hyperlipidemia    Hypertension    Microvascular ischemia of myocardium    Obesity    OSA (obstructive sleep apnea)    Prostate cancer [facility])     Past Surgical History:  Procedure Laterality Date   COLONOSCOPY WITH PROPOFOL N/A 10/05/2016   Procedure: COLONOSCOPY WITH PROPOFOL;  Surgeon: [name], MD;  Location: [facility] ENDOSCOPY;  Service: Endoscopy;  Laterality: N/A;   COLONOSCOPY WITH PROPOFOL N/A 10/04/2019   Procedure: COLONOSCOPY WITH PROPOFOL;  Surgeon: Toledo, [name], MD;  Location: [facility] ENDOSCOPY;  Service: Gastroenterology;  Laterality: N/A;   CYST REMOVAL TRUNK  1977   ESOPHAGOGASTRODUODENOSCOPY (EGD) WITH PROPOFOL N/A 10/04/2019   Procedure: ESOPHAGOGASTRODUODENOSCOPY (EGD) WITH PROPOFOL;  Surgeon: Toledo, [name], MD;  Location: [facility] ENDOSCOPY;  Service: Gastroenterology;  Laterality: N/A;   LEFT HEART CATH AND CORONARY ANGIOGRAPHY Left 05/17/2019   Procedure: LEFT  HEART CATH AND CORONARY ANGIOGRAPHY;  Surgeon: Corey Skains, MD;  Location: [place] CV LAB;  Service: Cardiovascular;  Laterality: Left;   NECK SURGERY  2005   PROSTATE SURGERY      There were no vitals filed for this visit.   Subjective Assessment - 04/10/21 1135     Subjective Pt ambulating into clinic without CAM boot. Reports no pain, some discomfort, just came from the gym. Minor soreness from previous session.    Pertinent History L Achilles Tendonitis. Pain began about 3 months ago. Gradually worsened. Had bone spurs in X-ray. MRI revealed no tears but a thickening of the tendon. Does not wear arch support. Also has a night boot and a CAM boot which have helped. Does not wear it if it rains. Also wears it occasionally. Wears CAM boot if he is going to be walking for a while.    Patient Stated Goals Stretch his Achilles out.    Currently in Pain? No/denies    Pain Onset More than a month ago             Manual therapy: Prone STM with and without using EDGE tool L Achillles tendon and calcaneous to decrease soft tissue restrictions: 15 minutes   Prone STM L plantar foot to decrease fascial restrictions: 5 minutes   Noted R calcaneal inversion. R side lying L foot  subtalar inversion mobs: Grade 3 mobs, 5x15 sec bouts     There.ex:   Seated RTB inversion and eversion: 3x20 reps/plane of motion. PT demo and cuing for set up for at home performance   Standing L eccentric heel raise/lowering off of 6" step with BUE support: 2x15. Cuing for foot placement  Seated gastroc stretch with belt: 3x30 sec      PT Education - 04/10/21 1136     Education Details form/technique with exercise    Person(s) Educated Patient    Methods Explanation;Demonstration;Tactile cues;Verbal cues    Comprehension Verbalized understanding;Returned demonstration              PT Short Term Goals - 03/13/21 1649       PT SHORT TERM GOAL #1   Title Pt will be independent with his  initial HEP to decrease pain, and improve ability to perform standing tasks more comfortably.    Baseline Pt has started his initial HEP (03/13/2021)    Time 3    Period Weeks    Status New    Target Date 04/03/21               PT Long Term Goals - 03/13/21 1650       PT LONG TERM GOAL #1   Title Pt will have a decrease in L Achilled pain to 2/10 or less at worst to promote ability to ambulate, negotiate stairs, and perform standing tasks more comfortably.    Baseline 7/10 L achilles pain at worst for the past 2 months (03/13/2021)    Time 6    Period Weeks    Status New    Target Date 04/24/21      PT LONG TERM GOAL #2   Title Pt will improve B ankle DF AROM to at least 10 degrees to promote ability to ambulate and perform standing tasks more comfortably.    Baseline Ankle DF AROM 4 degrees R, 3 degrees L (03/13/2021)    Time 6    Period Weeks    Status New    Target Date 04/24/21      PT LONG TERM GOAL #3   Title Pt will improve his FOTO score by at least 10 points as a demonstration of improved function.    Baseline L foot FOTO 67 (03/13/2021)    Time 6    Period Weeks    Status New    Target Date 04/24/21                   Plan - 04/10/21 1307     Clinical Impression Statement Following PT POC with focus on STM for improving fascial restrictions with ankle mobility/therex. More focus placed on lateral ankle mobility/strength today due to noted subtalar tilt in inversion to assist in improved ankle mechanics to off load achilles tendon. Pt tolerating progression of eccentric loading through greater ranges of motion. Pt will continue to benefit from skilled PT services to progress ankle strength/mobility to decrease standing with walking ADL's.    Personal Factors and Comorbidities Age;Comorbidity 3+;Time since onset of injury/illness/exacerbation;Fitness    Comorbidities CA, DM, HTN, obesity    Examination-Activity Limitations Stairs;Locomotion Level;Stand     Stability/Clinical Decision Making Stable/Uncomplicated    Rehab Potential Fair    PT Frequency 2x / week    PT Duration 6 weeks    PT Treatment/Interventions Therapeutic activities;Therapeutic exercise;Neuromuscular re-education;Patient/family education;Manual techniques;Dry needling;Aquatic Therapy;Electrical Stimulation;Iontophoresis 4mg /ml Dexamethasone    PT Next Visit Plan isometric,  concentric, eccentric loading, ankle and hip strength, manual techniques, modalities PRN    PT Home Exercise Plan Medbridge Access Code [email or web address]    [id number] and Agree with Plan of Care Patient             Patient will benefit from skilled therapeutic intervention in order to improve the following deficits and impairments:  Pain, Improper body mechanics, Difficulty walking, Decreased strength, Decreased range of motion  Visit Diagnosis: Pain in Achilles tendon  Pain in left foot     Problem List Patient Active Problem List   Diagnosis Date Noted   Personal history of gout 11/13/2020   History of prostate cancer 10/13/2019   CKD (chronic kidney disease) stage 3, GFR 30-59 ml/min ([place]) 06/05/2019   Microvascular ischemia of myocardium 06/01/2019   Angina pectoris ([place]) 05/10/2019   Encounter for general adult medical examination without abnormal findings 11/28/2018   Other obesity due to excess calories 07/26/2018   OSA on CPAP 11/30/2017   History of OCD (obsessive compulsive disorder) 06/01/2016   Mixed hypercholesterolemia and hypertriglyceridemia 04/20/2016   GERD without esophagitis 01/13/2016   Attention deficit disorder without hyperactivity 11/12/2015   Hypertension 05/14/2015   Diabetes mellitus type 2, uncontrolled, without complications 28/63/8177   Hypercalcemia 05/14/2015   Abdominal pain, LLQ (left lower quadrant) 04/23/2015   ED (erectile dysfunction) of organic origin 10/21/2012   Incomplete bladder emptying 10/21/2012   Malignant neoplasm of prostate ([place]) 10/21/2012    Testicular hypofunction 10/21/2012   Bladder outlet obstruction 10/21/2012    Salem Caster. Fairly IV, PT, DPT Physical Therapist- [facility]  04/10/2021, 1:20 PM  [place] [facility] [address] [place], [address] Phone: [phone]   Fax:  [phone]  Name: Scott Skinner MRN: [id number] Date of Birth: 03/28/1950

## 2021-04-15 ENCOUNTER — Ambulatory Visit: Payer: Medicare Other

## 2021-04-15 ENCOUNTER — Other Ambulatory Visit: Payer: Self-pay

## 2021-04-15 DIAGNOSIS — M7660 Achilles tendinitis, unspecified leg: Secondary | ICD-10-CM | POA: Diagnosis not present

## 2021-04-15 DIAGNOSIS — M79672 Pain in left foot: Secondary | ICD-10-CM

## 2021-04-15 NOTE — Therapy (Signed)
[place] [facility] [address] [place], [place], [address] Phone: [phone]   Fax:  [phone]  Physical Therapy Treatment  Patient Details  Name: Scott Skinner MRN: [id number] Date of Birth: 1950/11/24 Referring Provider (PT): [place], [place], [place]   Encounter Date: 04/15/2021   PT End of Session - 04/15/21 1550     Visit Number 7    Number of Visits 13    Date for PT Re-Evaluation 04/24/21    Authorization Type 7    Authorization Time Period 10    PT Start Time 1551    PT Stop Time 1629    PT Time Calculation (min) 38 min    Activity Tolerance Patient tolerated treatment well    Behavior During Therapy [facility] for tasks assessed/performed             Past Medical History:  Diagnosis Date   Cancer ([place])    prostate cancer    Chronic kidney disease    Complication of anesthesia    anaphylaxis    Diabetes mellitus without complication (HCC)    GERD (gastroesophageal reflux disease)    Gout    History of kidney stones    Hyperlipidemia    Hypertension    Microvascular ischemia of myocardium    Obesity    OSA (obstructive sleep apnea)    Prostate cancer ([place])     Past Surgical History:  Procedure Laterality Date   COLONOSCOPY WITH PROPOFOL N/A 10/05/2016   Procedure: COLONOSCOPY WITH PROPOFOL;  Surgeon: [name], MD;  Location: [facility] ENDOSCOPY;  Service: Endoscopy;  Laterality: N/A;   COLONOSCOPY WITH PROPOFOL N/A 10/04/2019   Procedure: COLONOSCOPY WITH PROPOFOL;  Surgeon: Toledo, [name], MD;  Location: [facility] ENDOSCOPY;  Service: Gastroenterology;  Laterality: N/A;   CYST REMOVAL TRUNK  1977   ESOPHAGOGASTRODUODENOSCOPY (EGD) WITH PROPOFOL N/A 10/04/2019   Procedure: ESOPHAGOGASTRODUODENOSCOPY (EGD) WITH PROPOFOL;  Surgeon: Toledo, [name], MD;  Location: [facility] ENDOSCOPY;  Service: Gastroenterology;  Laterality: N/A;   LEFT HEART CATH AND CORONARY ANGIOGRAPHY Left 05/17/2019   Procedure: LEFT  HEART CATH AND CORONARY ANGIOGRAPHY;  Surgeon: Corey Skains, MD;  Location: [place] CV LAB;  Service: Cardiovascular;  Laterality: Left;   NECK SURGERY  2005   PROSTATE SURGERY      There were no vitals filed for this visit.   Subjective Assessment - 04/15/21 1551     Subjective L Achilles might be getting a little better. The pain does not come as frequently as it used to.    Pertinent History L Achilles Tendonitis. Pain began about 3 months ago. Gradually worsened. Had bone spurs in X-ray. MRI revealed no tears but a thickening of the tendon. Does not wear arch support. Also has a night boot and a CAM boot which have helped. Does not wear it if it rains. Also wears it occasionally. Wears CAM boot if he is going to be walking for a while.    Patient Stated Goals Stretch his Achilles out.    Currently in Pain? No/denies    Pain Onset More than a month ago                                        PT Education - 04/15/21 1621     Education Details ther-ex    [employer]) Educated Patient    Methods  Explanation;Demonstration;Tactile cues;Verbal cues    Comprehension Returned demonstration;Verbalized understanding           Objective          DM and HTN are both controlled per pt       Medbridge Access Code [phone]   Manual therapy  Prone STM with and without using EDGE tool L Achillles tendon and calcaneous to decrease soft tissue restrictions   Prone STM L plantar foot to decrease fascial restrictions        Therapeutic exercise   Pt not wearing a CAM boot observed.   Prone manually resisted eccentric PF 10x3 with 5 second holds at end range PF  Static mini lunge L LE with R UE assist 10x3  SLS L LE with R UE two finger assist 10x5 seconds for 2 sets  Standing ankle DF/PF on rockerboard B UE assist 2 minutes       Standing L eccentric heel raise/lowering off of first regular step with BUE support: 2x15.        Improved  exercise technique, movement at target joints, use of target muscles after mod verbal, visual, tactile cues.      Response to treatment Pt tolerated session well without aggravation of symptoms.      Clinical impression   Improving L Achilles pain based on subjective reports and observation of not wearing a CAM boot into session. Continued working on decreasing fascial restrictions to L achilles tendon and heel using edge tool and plantar foot as well as promoting blood flow and proper stress to affected tissues to promote healing through muscle contraction. Pt tolerated session well without aggravation of symptoms. Pt will benefit from continued skilled physical therapy services to decrease pain, improve strength and function.      PT Short Term Goals - 03/13/21 1649       PT SHORT TERM GOAL #1   Title Pt will be independent with his initial HEP to decrease pain, and improve ability to perform standing tasks more comfortably.    Baseline Pt has started his initial HEP (03/13/2021)    Time 3    Period Weeks    Status New    Target Date 04/03/21               PT Long Term Goals - 03/13/21 1650       PT LONG TERM GOAL #1   Title Pt will have a decrease in L Achilled pain to 2/10 or less at worst to promote ability to ambulate, negotiate stairs, and perform standing tasks more comfortably.    Baseline 7/10 L achilles pain at worst for the past 2 months (03/13/2021)    Time 6    Period Weeks    Status New    Target Date 04/24/21      PT LONG TERM GOAL #2   Title Pt will improve B ankle DF AROM to at least 10 degrees to promote ability to ambulate and perform standing tasks more comfortably.    Baseline Ankle DF AROM 4 degrees R, 3 degrees L (03/13/2021)    Time 6    Period Weeks    Status New    Target Date 04/24/21      PT LONG TERM GOAL #3   Title Pt will improve his FOTO score by at least 10 points as a demonstration of improved function.    Baseline L foot FOTO 67  (03/13/2021)    Time 6    Period Weeks  Status New    Target Date 04/24/21                   Plan - 04/15/21 1621     Clinical Impression Statement Improving L Achilles pain based on subjective reports and observation of not wearing a CAM boot into session. Continued working on decreasing fascial restrictions to L achilles tendon and heel using edge tool and plantar foot as well as promoting blood flow and proper stress to affected tissues to promote healing through muscle contraction. Pt tolerated session well without aggravation of symptoms. Pt will benefit from continued skilled physical therapy services to decrease pain, improve strength and function.    Personal Factors and Comorbidities Age;Comorbidity 3+;Time since onset of injury/illness/exacerbation;Fitness    Comorbidities CA, DM, HTN, obesity    Examination-Activity Limitations Stairs;Locomotion Level;Stand    Stability/Clinical Decision Making Stable/Uncomplicated    Clinical Decision Making Low    Rehab Potential Fair    PT Frequency 2x / week    PT Duration 6 weeks    PT Treatment/Interventions Therapeutic activities;Therapeutic exercise;Neuromuscular re-education;Patient/family education;Manual techniques;Dry needling;Aquatic Therapy;Electrical Stimulation;Iontophoresis 4mg /ml Dexamethasone    PT Next Visit Plan isometric, concentric, eccentric loading, ankle and hip strength, manual techniques, modalities PRN    PT Home Exercise Plan Medbridge Access Code [email or web address]    [id number] and Agree with Plan of Care Patient             Patient will benefit from skilled therapeutic intervention in order to improve the following deficits and impairments:  Pain, Improper body mechanics, Difficulty walking, Decreased strength, Decreased range of motion  Visit Diagnosis: Pain in Achilles tendon  Pain in left foot     Problem List Patient Active Problem List   Diagnosis Date Noted   Personal history of gout  11/13/2020   History of prostate cancer 10/13/2019   CKD (chronic kidney disease) stage 3, GFR 30-59 ml/min ([place]) 06/05/2019   Microvascular ischemia of myocardium 06/01/2019   Angina pectoris ([place]) 05/10/2019   Encounter for general adult medical examination without abnormal findings 11/28/2018   Other obesity due to excess calories 07/26/2018   OSA on CPAP 11/30/2017   History of OCD (obsessive compulsive disorder) 06/01/2016   Mixed hypercholesterolemia and hypertriglyceridemia 04/20/2016   GERD without esophagitis 01/13/2016   Attention deficit disorder without hyperactivity 11/12/2015   Hypertension 05/14/2015   Diabetes mellitus type 2, uncontrolled, without complications 99/35/7017   Hypercalcemia 05/14/2015   Abdominal pain, LLQ (left lower quadrant) 04/23/2015   ED (erectile dysfunction) of organic origin 10/21/2012   Incomplete bladder emptying 10/21/2012   Malignant neoplasm of prostate ([place]) 10/21/2012   Testicular hypofunction 10/21/2012   Bladder outlet obstruction 10/21/2012   Joneen Boers PT, DPT  04/15/2021, 4:32 PM   [place] [facility] [address], [place], [address] Phone: [phone]   Fax:  [phone]  Name: Scott Skinner MRN: [id number] Date of Birth: 01/09/1951

## 2021-04-17 ENCOUNTER — Ambulatory Visit: Payer: Medicare Other | Attending: Podiatry

## 2021-04-17 ENCOUNTER — Other Ambulatory Visit: Payer: Self-pay

## 2021-04-17 DIAGNOSIS — M7660 Achilles tendinitis, unspecified leg: Secondary | ICD-10-CM | POA: Insufficient documentation

## 2021-04-17 DIAGNOSIS — M79672 Pain in left foot: Secondary | ICD-10-CM | POA: Insufficient documentation

## 2021-04-17 NOTE — Therapy (Signed)
[place] [facility] [address], [place], [address] Phone: [phone]   Fax:  [phone]  Physical Therapy Treatment  Patient Details  Name: Scott Skinner MRN: [id number] Date of Birth: September 26, 1950 Referring Provider (PT): [place], [place] T, [place]   Encounter Date: 04/17/2021   PT End of Session - 04/17/21 1151     Visit Number 8    Number of Visits 13    Date for PT Re-Evaluation 04/24/21    Authorization Type 8    Authorization Time Period 10    PT Start Time 1152    PT Stop Time 1226    PT Time Calculation (min) 34 min    Activity Tolerance Patient tolerated treatment well    Behavior During Therapy [facility] for tasks assessed/performed             Past Medical History:  Diagnosis Date   Cancer ([place])    prostate cancer    Chronic kidney disease    Complication of anesthesia    anaphylaxis    Diabetes mellitus without complication (HCC)    GERD (gastroesophageal reflux disease)    Gout    History of kidney stones    Hyperlipidemia    Hypertension    Microvascular ischemia of myocardium    Obesity    OSA (obstructive sleep apnea)    Prostate cancer ([place])     Past Surgical History:  Procedure Laterality Date   COLONOSCOPY WITH PROPOFOL N/A 10/05/2016   Procedure: COLONOSCOPY WITH PROPOFOL;  Surgeon: [name], MD;  Location: [facility] ENDOSCOPY;  Service: Endoscopy;  Laterality: N/A;   COLONOSCOPY WITH PROPOFOL N/A 10/04/2019   Procedure: COLONOSCOPY WITH PROPOFOL;  Surgeon: Toledo, [name], MD;  Location: [facility] ENDOSCOPY;  Service: Gastroenterology;  Laterality: N/A;   CYST REMOVAL TRUNK  1977   ESOPHAGOGASTRODUODENOSCOPY (EGD) WITH PROPOFOL N/A 10/04/2019   Procedure: ESOPHAGOGASTRODUODENOSCOPY (EGD) WITH PROPOFOL;  Surgeon: Toledo, [name], MD;  Location: [facility] ENDOSCOPY;  Service: Gastroenterology;  Laterality: N/A;   LEFT HEART CATH AND CORONARY ANGIOGRAPHY Left 05/17/2019   Procedure: LEFT  HEART CATH AND CORONARY ANGIOGRAPHY;  Surgeon: Corey Skains, MD;  Location: [facility];  Service: Cardiovascular;  Laterality: Left;   NECK SURGERY  2005   PROSTATE SURGERY      There were no vitals filed for this visit.   Subjective Assessment - 04/17/21 1153     Subjective Pt endorsing 2/10 L achilles pain as worst pain since last visit. Currently at 2/10 NPS. Reports having a cold.    Pertinent History L Achilles Tendonitis. Pain began about 3 months ago. Gradually worsened. Had bone spurs in X-ray. MRI revealed no tears but a thickening of the tendon. Does not wear arch support. Also has a night boot and a CAM boot which have helped. Does not wear it if it rains. Also wears it occasionally. Wears CAM boot if he is going to be walking for a while.    Patient Stated Goals Stretch his Achilles out.    Currently in Pain? Yes    Pain Score 2     Pain Location Ankle    Pain Orientation Left    Pain Descriptors / Indicators Aching;Sharp    Pain Onset More than a month ago             Manual therapy: 15 minutes total pt in prone   Prone STM with using EDGE tool L Achilles tendon and calcaneous  to decrease soft tissue restrictions   Prone STM L plantar foot to decrease fascial restrictions         Therapeutic exercise SLS on LLE on airex with R two finger support. 3x30 sec. Close supervision Static mini lunges onto airex pad on front LE (LLE) with SUE support: 2x10      Standing L eccentric heel raise/lowering off of first regular step with BUE support: 2x15. Cuing for slowing down eccentric control for further challenge and benefit to tendon loading.      Session ended a little early due to pt feeling sick.    PT Education - 04/17/21 1151     Education Details form/technique with exercise.    Person(s) Educated Patient    Methods Explanation;Demonstration;Tactile cues;Verbal cues    Comprehension Verbalized understanding;Returned demonstration               PT Short Term Goals - 03/13/21 1649       PT SHORT TERM GOAL #1   Title Pt will be independent with his initial HEP to decrease pain, and improve ability to perform standing tasks more comfortably.    Baseline Pt has started his initial HEP (03/13/2021)    Time 3    Period Weeks    Status New    Target Date 04/03/21               PT Long Term Goals - 03/13/21 1650       PT LONG TERM GOAL #1   Title Pt will have a decrease in L Achilled pain to 2/10 or less at worst to promote ability to ambulate, negotiate stairs, and perform standing tasks more comfortably.    Baseline 7/10 L achilles pain at worst for the past 2 months (03/13/2021)    Time 6    Period Weeks    Status New    Target Date 04/24/21      PT LONG TERM GOAL #2   Title Pt will improve B ankle DF AROM to at least 10 degrees to promote ability to ambulate and perform standing tasks more comfortably.    Baseline Ankle DF AROM 4 degrees R, 3 degrees L (03/13/2021)    Time 6    Period Weeks    Status New    Target Date 04/24/21      PT LONG TERM GOAL #3   Title Pt will improve his FOTO score by at least 10 points as a demonstration of improved function.    Baseline L foot FOTO 67 (03/13/2021)    Time 6    Period Weeks    Status New    Target Date 04/24/21                   Plan - 04/17/21 1231     Clinical Impression Statement Session overall limited due to running late form MD appointment and leaving a littel early due to feeling unwell. Continuing PT POC with tendon loading, ankle stability, and STM techniques for pain modulation and improvement in tendon healing. Next week pt will need goal reassessment to either continue PT or discharge. Pt reporting subjectively he overall feels like his pain has been improving. Will continue POC as appropriate.    Personal Factors and Comorbidities Age;Comorbidity 3+;Time since onset of injury/illness/exacerbation;Fitness    Comorbidities CA, DM, HTN,  obesity    Examination-Activity Limitations Stairs;Locomotion Level;Stand    Stability/Clinical Decision Making Stable/Uncomplicated    Rehab Potential Fair    PT  Frequency 2x / week    PT Duration 6 weeks    PT Treatment/Interventions Therapeutic activities;Therapeutic exercise;Neuromuscular re-education;Patient/family education;Manual techniques;Dry needling;Aquatic Therapy;Electrical Stimulation;Iontophoresis 4mg /ml Dexamethasone    PT Next Visit Plan isometric, concentric, eccentric loading, ankle and hip strength, manual techniques, modalities PRN; goal reassessment    PT Home Exercise Plan Medbridge Access Code [place] and Agree with Plan of Care Patient             Patient will benefit from skilled therapeutic intervention in order to improve the following deficits and impairments:  Pain, Improper body mechanics, Difficulty walking, Decreased strength, Decreased range of motion  Visit Diagnosis: Pain in Achilles tendon  Pain in left foot     Problem List Patient Active Problem List   Diagnosis Date Noted   Personal history of gout 11/13/2020   History of prostate cancer 10/13/2019   CKD (chronic kidney disease) stage 3, GFR 30-59 ml/min ([place]) 06/05/2019   Microvascular ischemia of myocardium 06/01/2019   Angina pectoris ([place]) 05/10/2019   Encounter for general adult medical examination without abnormal findings 11/28/2018   Other obesity due to excess calories 07/26/2018   OSA on CPAP 11/30/2017   History of OCD (obsessive compulsive disorder) 06/01/2016   Mixed hypercholesterolemia and hypertriglyceridemia 04/20/2016   GERD without esophagitis 01/13/2016   Attention deficit disorder without hyperactivity 11/12/2015   Hypertension 05/14/2015   Diabetes mellitus type 2, uncontrolled, without complications 04/59/9774   Hypercalcemia 05/14/2015   Abdominal pain, LLQ (left lower quadrant) 04/23/2015   ED (erectile dysfunction) of organic origin  10/21/2012   Incomplete bladder emptying 10/21/2012   Malignant neoplasm of prostate ([place]) 10/21/2012   Testicular hypofunction 10/21/2012   Bladder outlet obstruction 10/21/2012    Salem Caster. Fairly IV, PT, DPT Physical Therapist- [facility]  04/17/2021, 12:37 PM  [place] [facility] [address], [place], [address] Phone: [phone]   Fax:  [phone]  Name: Scott Skinner MRN: [id number] Date of Birth: 06-May-1950

## 2021-04-21 ENCOUNTER — Encounter: Payer: Self-pay | Admitting: Podiatry

## 2021-04-22 ENCOUNTER — Ambulatory Visit: Payer: Medicare Other

## 2021-04-24 ENCOUNTER — Ambulatory Visit: Payer: Medicare Other

## 2021-04-24 ENCOUNTER — Other Ambulatory Visit: Payer: Self-pay

## 2021-04-24 DIAGNOSIS — M79672 Pain in left foot: Secondary | ICD-10-CM

## 2021-04-24 DIAGNOSIS — M7660 Achilles tendinitis, unspecified leg: Secondary | ICD-10-CM

## 2021-04-24 NOTE — Therapy (Signed)
[place] [facility] [address], [place], [address] Phone: [phone]   Fax:  [phone]  Physical Therapy Treatment/Recertification  Patient Details  Name: Scott Skinner MRN: [id number] Date of Birth: November 18, 1950 Referring Provider (PT): [place], [place] T, [place]   Encounter Date: 04/24/2021   PT End of Session - 04/24/21 1151     Visit Number 9    Number of Visits 21    Date for PT Re-Evaluation 05/22/21    Authorization Type 8    Authorization Time Period 10    PT Start Time 1148    PT Stop Time 1220    PT Time Calculation (min) 32 min    Activity Tolerance Patient tolerated treatment well    Behavior During Therapy [facility] for tasks assessed/performed             Past Medical History:  Diagnosis Date   Cancer ([place])    prostate cancer    Chronic kidney disease    Complication of anesthesia    anaphylaxis    Diabetes mellitus without complication (HCC)    GERD (gastroesophageal reflux disease)    Gout    History of kidney stones    Hyperlipidemia    Hypertension    Microvascular ischemia of myocardium    Obesity    OSA (obstructive sleep apnea)    Prostate cancer ([place])     Past Surgical History:  Procedure Laterality Date   COLONOSCOPY WITH PROPOFOL N/A 10/05/2016   Procedure: COLONOSCOPY WITH PROPOFOL;  Surgeon: [name], MD;  Location: [facility] ENDOSCOPY;  Service: Endoscopy;  Laterality: N/A;   COLONOSCOPY WITH PROPOFOL N/A 10/04/2019   Procedure: COLONOSCOPY WITH PROPOFOL;  Surgeon: Toledo, [name], MD;  Location: [facility] ENDOSCOPY;  Service: Gastroenterology;  Laterality: N/A;   CYST REMOVAL TRUNK  1977   ESOPHAGOGASTRODUODENOSCOPY (EGD) WITH PROPOFOL N/A 10/04/2019   Procedure: ESOPHAGOGASTRODUODENOSCOPY (EGD) WITH PROPOFOL;  Surgeon: Toledo, [name], MD;  Location: [facility] ENDOSCOPY;  Service: Gastroenterology;  Laterality: N/A;   LEFT HEART CATH AND CORONARY ANGIOGRAPHY Left 05/17/2019    Procedure: LEFT HEART CATH AND CORONARY ANGIOGRAPHY;  Surgeon: Corey Skains, MD;  Location: [place] [facility];  Service: Cardiovascular;  Laterality: Left;   NECK SURGERY  2005   PROSTATE SURGERY      There were no vitals filed for this visit.   Subjective Assessment - 04/24/21 1149     Subjective Pt reporting visiting doctor due to feeling sick. Does not have flu or covid. Feeling better but still under the weather. L heel no pain currently. Did hurt some with standing activities. When pain occurs, pain improves quicker than before.    Pertinent History L Achilles Tendonitis. Pain began about 3 months ago. Gradually worsened. Had bone spurs in X-ray. MRI revealed no tears but a thickening of the tendon. Does not wear arch support. Also has a night boot and a CAM boot which have helped. Does not wear it if it rains. Also wears it occasionally. Wears CAM boot if he is going to be walking for a while.    Patient Stated Goals Stretch his Achilles out.    Currently in Pain? No/denies    Pain Onset More than a month ago    Pain Frequency Occasional              Reassessment of short term and long term goals. See goals section and clinical impression. Pt making steady progress towards all goals.  HEP updated with [facility] ankle DF for soleus stretch and talocrural joint mobility along with resisted ankle inversion and eversion for lateral and medial ankle stability with ambulatory tasks. New hand out provided. Session ended early due to pt still feeling under the weather.    PT Education - 04/24/21 1150     Education Details form/technique with exercise.    Person(s) Educated Patient    Methods Explanation;Demonstration;Tactile cues;Verbal cues    Comprehension Verbalized understanding;Returned demonstration              PT Short Term Goals - 04/24/21 1152       PT SHORT TERM GOAL #1   Title Pt will be independent with his initial HEP to decrease pain, and improve ability to  perform standing tasks more comfortably.    Baseline Pt has started his initial HEP (03/13/2021); 04/24/21: indep completing HEP ~ 3-4x/week    Time 3    Period Weeks    Status Achieved    Target Date 04/24/21               PT Long Term Goals - 04/24/21 1153       PT LONG TERM GOAL #1   Title Pt will have a decrease in L Achilles pain to 2/10 or less at worst to promote ability to ambulate, negotiate stairs, and perform standing tasks more comfortably.    Baseline 7/10 L achilles pain at worst for the past 2 months (03/13/2021); 04/24/21: 3/10 NPS with stairs, standing, ambulating.    Time 4    Period Weeks    Status New    Target Date 05/22/21      PT LONG TERM GOAL #2   Title Pt will improve B ankle DF AROM to at least 10 degrees to promote ability to ambulate and perform standing tasks more comfortably.    Baseline Ankle DF AROM 4 degrees R, 3 degrees L (03/13/2021); R/L ankle DF AROM: 10 deg/10 deg    Time 4    Period Weeks    Status On-going    Target Date 05/22/21      PT LONG TERM GOAL #3   Title Pt will improve his FOTO score by at least 10 points as a demonstration of improved function.    Baseline L foot FOTO 67 (03/13/2021); 04/24/21: 72    Time 4    Period Weeks    Status On-going    Target Date 05/22/21                   Plan - 04/24/21 1243     Clinical Impression Statement Pt making steady progress towards all long term goals but has only achieved his short term goal. HEp updated on improving lateral ankle strength/stability and ankle DF AROM to improve gait mechanics and pain in L achilles. Updated program printed and provided. Session ended early due to pt still feeling under the weather. Plan on additional 4 weeks of PT to meet long term goals.    Personal Factors and Comorbidities Age;Comorbidity 3+;Time since onset of injury/illness/exacerbation;Fitness    Comorbidities CA, DM, HTN, obesity    Examination-Activity Limitations Stairs;Locomotion  Level;Stand    Stability/Clinical Decision Making Stable/Uncomplicated    Clinical Decision Making Low    Rehab Potential Fair    PT Frequency 2x / week    PT Duration 4 weeks    PT Treatment/Interventions Therapeutic activities;Therapeutic exercise;Neuromuscular re-education;Patient/family education;Manual techniques;Dry needling;Aquatic Therapy;Electrical Stimulation;Iontophoresis 4mg /ml Dexamethasone    PT Next  Visit Plan Ankle DF AROM and lateral ankle strength    PT Home Exercise Plan Medbridge Access Code [email or web address]    [email or web address] and Agree with Plan of Care Patient             Patient will benefit from skilled therapeutic intervention in order to improve the following deficits and impairments:  Pain, Improper body mechanics, Difficulty walking, Decreased strength, Decreased range of motion  Visit Diagnosis: Pain in Achilles tendon  Pain in left foot     Problem List Patient Active Problem List   Diagnosis Date Noted   Personal history of gout 11/13/2020   History of prostate cancer 10/13/2019   CKD (chronic kidney disease) stage 3, GFR 30-59 ml/min ([place]) 06/05/2019   Microvascular ischemia of myocardium 06/01/2019   Angina pectoris ([place]) 05/10/2019   Encounter for general adult medical examination without abnormal findings 11/28/2018   Other obesity due to excess calories 07/26/2018   OSA on CPAP 11/30/2017   History of OCD (obsessive compulsive disorder) 06/01/2016   Mixed hypercholesterolemia and hypertriglyceridemia 04/20/2016   GERD without esophagitis 01/13/2016   Attention deficit disorder without hyperactivity 11/12/2015   Hypertension 05/14/2015   Diabetes mellitus type 2, uncontrolled, without complications 37/85/8850   Hypercalcemia 05/14/2015   Abdominal pain, LLQ (left lower quadrant) 04/23/2015   ED (erectile dysfunction) of organic origin 10/21/2012   Incomplete bladder emptying 10/21/2012   Malignant neoplasm of prostate ([place]) 10/21/2012   Testicular  hypofunction 10/21/2012   Bladder outlet obstruction 10/21/2012    Salem Caster. Fairly IV, PT, DPT Physical Therapist- [facility]  04/24/2021, 12:46 PM  [place] [facility] [address] [place], [address] Phone: [phone]   Fax:  [phone]  Name: JAVED COTTO MRN: [id number] Date of Birth: 07-23-1950

## 2021-04-29 ENCOUNTER — Ambulatory Visit: Payer: Medicare Other

## 2021-05-01 ENCOUNTER — Ambulatory Visit: Payer: Medicare Other

## 2021-05-05 ENCOUNTER — Other Ambulatory Visit: Payer: Self-pay

## 2021-05-05 ENCOUNTER — Ambulatory Visit: Payer: Medicare Other

## 2021-05-05 DIAGNOSIS — M79672 Pain in left foot: Secondary | ICD-10-CM

## 2021-05-05 DIAGNOSIS — M7660 Achilles tendinitis, unspecified leg: Secondary | ICD-10-CM

## 2021-05-05 NOTE — Therapy (Signed)
[facility] [address], [place], [address] Phone: [phone]   Fax:  [phone]  Physical Therapy Treatment And Progress Report (03/13/2021 - 05/05/2021)  Patient Details  Name: Scott Skinner MRN: [id number] Date of Birth: 05-28-1950 Referring Provider (PT): [place], [place], [place]   Encounter Date: 05/05/2021   PT End of Session - 05/05/21 1020     Visit Number 10    Number of Visits 21    Date for PT Re-Evaluation 05/22/21    Authorization Type 10    Authorization Time Period 10    PT Start Time 1020    PT Stop Time 1057    PT Time Calculation (min) 37 min    Activity Tolerance Patient tolerated treatment well    Behavior During Therapy [facility] for tasks assessed/performed             Past Medical History:  Diagnosis Date   Cancer ([place])    prostate cancer    Chronic kidney disease    Complication of anesthesia    anaphylaxis    Diabetes mellitus without complication (HCC)    GERD (gastroesophageal reflux disease)    Gout    History of kidney stones    Hyperlipidemia    Hypertension    Microvascular ischemia of myocardium    Obesity    OSA (obstructive sleep apnea)    Prostate cancer [facility])     Past Surgical History:  Procedure Laterality Date   COLONOSCOPY WITH PROPOFOL N/A 10/05/2016   Procedure: COLONOSCOPY WITH PROPOFOL;  Surgeon: [name], MD;  Location: [facility] ENDOSCOPY;  Service: Endoscopy;  Laterality: N/A;   COLONOSCOPY WITH PROPOFOL N/A 10/04/2019   Procedure: COLONOSCOPY WITH PROPOFOL;  Surgeon: Toledo, [name], MD;  Location: [facility] ENDOSCOPY;  Service: Gastroenterology;  Laterality: N/A;   CYST REMOVAL TRUNK  1977   ESOPHAGOGASTRODUODENOSCOPY (EGD) WITH PROPOFOL N/A 10/04/2019   Procedure: ESOPHAGOGASTRODUODENOSCOPY (EGD) WITH PROPOFOL;  Surgeon: Toledo, [name], MD;  Location: [facility] ENDOSCOPY;  Service: Gastroenterology;  Laterality: N/A;   LEFT HEART CATH AND  CORONARY ANGIOGRAPHY Left 05/17/2019   Procedure: LEFT HEART CATH AND CORONARY ANGIOGRAPHY;  Surgeon: Corey Skains, MD;  Location: [place] CV LAB;  Service: Cardiovascular;  Laterality: Left;   NECK SURGERY  2005   PROSTATE SURGERY      There were no vitals filed for this visit.   Subjective Assessment - 05/05/21 1021     Subjective L Achilles is improved. A couple of days last week when he was on his feet for a bit and did not have to take his anti inflammatories. Did his exercises.    Pertinent History L Achilles Tendonitis. Pain began about 3 months ago. Gradually worsened. Had bone spurs in X-ray. MRI revealed no tears but a thickening of the tendon. Does not wear arch support. Also has a night boot and a CAM boot which have helped. Does not wear it if it rains. Also wears it occasionally. Wears CAM boot if he is going to be walking for a while.    Patient Stated Goals Stretch his Achilles out.    Currently in Pain? No/denies    Pain Onset More than a month ago                                        PT Education - 05/05/21  1243     Education Details ther-ex    Person(s) Educated Patient    Methods Explanation;Demonstration;Tactile cues;Verbal cues    Comprehension Returned demonstration;Verbalized understanding           Objective          DM and HTN are both controlled per pt       Medbridge Access Code [email or web address]   Manual therapy  Prone STM  using EDGE tool L Achillles tendon and calcaneous to decrease soft tissue restrictions   Prone STM L plantar foot to decrease fascial restrictions        Therapeutic exercise   Pt not wearing a CAM boot observed.    Supine L ankle DF AROM: 11 degrees (R 4 degrees)  Standing L eccentric heel raise/lowering off of first regular step with BUE support: 2x15.   Static mini lunge L LE with R UE assist 10x  BP L arm sitting, mechanically taken, normal cuff: 155/73, HR 79         Improved exercise technique, movement at target joints, use of target muscles after min to mod verbal, visual, tactile cues.      Response to treatment Pt tolerated session well without aggravation of Achilles pain.        Clinical impression Lighter session today secondary to pt still recovering from being sick.  Pt demonstrates improved L Achilles pain, improved L ankle DF AROM and function since initial evaluation. Pt making progress with PT towards goals. Pt will benefit from continued skilled physical therapy services to decrease pain, improve strength and function.       PT Short Term Goals - 04/24/21 1152       PT SHORT TERM GOAL #1   Title Pt will be independent with his initial HEP to decrease pain, and improve ability to perform standing tasks more comfortably.    Baseline Pt has started his initial HEP (03/13/2021); 04/24/21: indep completing HEP ~ 3-4x/week    Time 3    Period Weeks    Status Achieved    Target Date 04/24/21               PT Long Term Goals - 05/05/21 1022       PT LONG TERM GOAL #1   Title Pt will have a decrease in L Achilles pain to 2/10 or less at worst to promote ability to ambulate, negotiate stairs, and perform standing tasks more comfortably.    Baseline 7/10 L achilles pain at worst for the past 2 months (03/13/2021); 04/24/21: 3/10 NPS with stairs, standing, ambulating.; 2/10 at most for the past 7 days (05/05/2021)    Time 4    Period Weeks    Status Achieved    Target Date 05/22/21      PT LONG TERM GOAL #2   Title Pt will improve B ankle DF AROM to at least 10 degrees to promote ability to ambulate and perform standing tasks more comfortably.    Baseline Ankle DF AROM 4 degrees R, 3 degrees L (03/13/2021); R/L ankle DF AROM: 10 deg/10 deg;  L ankle 11 degrees, R ankle 4 degreees (05/05/2021)    Time 4    Period Weeks    Status Partially Met    Target Date 05/22/21      PT LONG TERM GOAL #3   Title Pt will improve his FOTO score  by at least 10 points as a demonstration of improved function.    Baseline L foot FOTO  67 (03/13/2021); 04/24/21: 72; 72 (05/05/2021)    Time 4    Period Weeks    Status Partially Met    Target Date 05/22/21                   Plan - 05/05/21 1243     Clinical Impression Statement Lighter session today secondary to pt still recovering from being sick.  Pt demonstrates improved L Achilles pain, improved L ankle DF AROM and function since initial evaluation. Pt making progress with PT towards goals. Pt will benefit from continued skilled physical therapy services to decrease pain, improve strength and function.    Personal Factors and Comorbidities Age;Comorbidity 3+;Time since onset of injury/illness/exacerbation;Fitness    Comorbidities CA, DM, HTN, obesity    Examination-Activity Limitations Stairs;Locomotion Level;Stand    Stability/Clinical Decision Making Stable/Uncomplicated    Clinical Decision Making Low    Rehab Potential Fair    PT Frequency 2x / week    PT Duration 4 weeks    PT Treatment/Interventions Therapeutic activities;Therapeutic exercise;Neuromuscular re-education;Patient/family education;Manual techniques;Dry needling;Aquatic Therapy;Electrical Stimulation;Iontophoresis 14m/ml Dexamethasone    PT Next Visit Plan Ankle DF AROM and lateral ankle strength    PT Home Exercise Plan Medbridge Access Code [email or web address]    JZPHXTAVWand Agree with Plan of Care Patient             Patient will benefit from skilled therapeutic intervention in order to improve the following deficits and impairments:  Pain, Improper body mechanics, Difficulty walking, Decreased strength, Decreased range of motion  Visit Diagnosis: Pain in Achilles tendon  Pain in left foot     Problem List Patient Active Problem List   Diagnosis Date Noted   Personal history of gout 11/13/2020   History of prostate cancer 10/13/2019   CKD (chronic kidney disease) stage 3, GFR 30-59 ml/min ([place]  06/05/2019   Microvascular ischemia of myocardium 06/01/2019   Angina pectoris ([place] 05/10/2019   Encounter for general adult medical examination without abnormal findings 11/28/2018   Other obesity due to excess calories 07/26/2018   OSA on CPAP 11/30/2017   History of OCD (obsessive compulsive disorder) 06/01/2016   Mixed hypercholesterolemia and hypertriglyceridemia 04/20/2016   GERD without esophagitis 01/13/2016   Attention deficit disorder without hyperactivity 11/12/2015   Hypertension 05/14/2015   Diabetes mellitus type 2, uncontrolled, without complications 097/94/8016  Hypercalcemia 05/14/2015   Abdominal pain, LLQ (left lower quadrant) 04/23/2015   ED (erectile dysfunction) of organic origin 10/21/2012   Incomplete bladder emptying 10/21/2012   Malignant neoplasm of prostate ([place] 10/21/2012   Testicular hypofunction 10/21/2012   Bladder outlet obstruction 10/21/2012   Thank you for your referral.  MJoneen BoersPT, DPT  05/05/2021, 12:47 PM  [facility] [address] [place] [address]Phone: [phone]  Fax:  [phone] Name: RMEGHAN TIEMANNMRN: [id number] of Birth: 8Nov 13, 1952

## 2021-05-07 ENCOUNTER — Ambulatory Visit (INDEPENDENT_AMBULATORY_CARE_PROVIDER_SITE_OTHER): Payer: Medicare Other | Admitting: Podiatry

## 2021-05-07 ENCOUNTER — Encounter: Payer: Self-pay | Admitting: Podiatry

## 2021-05-07 ENCOUNTER — Ambulatory Visit: Payer: Medicare Other

## 2021-05-07 ENCOUNTER — Other Ambulatory Visit: Payer: Self-pay

## 2021-05-07 DIAGNOSIS — M7660 Achilles tendinitis, unspecified leg: Secondary | ICD-10-CM

## 2021-05-07 DIAGNOSIS — M7662 Achilles tendinitis, left leg: Secondary | ICD-10-CM

## 2021-05-07 DIAGNOSIS — L603 Nail dystrophy: Secondary | ICD-10-CM | POA: Diagnosis not present

## 2021-05-07 DIAGNOSIS — M79672 Pain in left foot: Secondary | ICD-10-CM

## 2021-05-07 MED ORDER — TERBINAFINE HCL 250 MG PO TABS: 250.0000 mg | ORAL_TABLET | Freq: Every day | ORAL | 0 refills | Status: DC

## 2021-05-07 NOTE — Progress Notes (Signed)
Scott Skinner presents today for follow-up of his nail fungus he says is almost gone after the first 30 days of medicine he says it seems to be grown out.  He states his looks like it is cleared up 100% he is also here for follow-up of his Achilles tendinitis for which an MRI had indicated that he simply had some mild tendinosis which he has been taking physical therapy for.  He states that he is better than 75 to 80% improved and he can usually get through 6 hours out of the day of standing before becomes tender and then he takes anti-inflammatory and uses his boot if necessary.  Objective: Vital signs are stable alert and oriented x3.  Pulses are palpable.  Hallux nail left does demonstrate near 100% clearance at this time.  There is a very fine area just around the margin distally that demonstrates possible onychomycosis so at this point I think to run another dose of Lamisil would be a good idea.  No tenderness on palpation of the Achilles left.  Assessment well-healing Achilles tendinitis and onychomycosis plan: Debrided I am going to also recommend a few more days of physical therapy to help alleviate his symptoms.  Plan: Recommend he continue physical therapy and another dose of Lamisil.  Follow-up with him in 1 month

## 2021-05-07 NOTE — Therapy (Signed)
[facility] [address], [place], [address] Phone: [phone]   Fax:  [phone]  Physical Therapy Treatment  Patient Details  Name: Scott Skinner MRN: [id number] Date of Birth: 12-09-1950 Referring Provider (PT): [place], [place], [place]   Encounter Date: 05/07/2021   PT End of Session - 05/07/21 1720     Visit Number 11    Number of Visits 21    Date for PT Re-Evaluation 05/22/21    Authorization Type 1    Authorization Time Period 10    PT Start Time 1721    PT Stop Time 1753    PT Time Calculation (min) 32 min    Activity Tolerance Patient tolerated treatment well    Behavior During Therapy [facility] for tasks assessed/performed             Past Medical History:  Diagnosis Date   Cancer ([place])    prostate cancer    Chronic kidney disease    Complication of anesthesia    anaphylaxis    Diabetes mellitus without complication (HCC)    GERD (gastroesophageal reflux disease)    Gout    History of kidney stones    Hyperlipidemia    Hypertension    Microvascular ischemia of myocardium    Obesity    OSA (obstructive sleep apnea)    Prostate cancer ([place])     Past Surgical History:  Procedure Laterality Date   COLONOSCOPY WITH PROPOFOL N/A 10/05/2016   Procedure: COLONOSCOPY WITH PROPOFOL;  Surgeon: [name], MD;  Location: [facility] ENDOSCOPY;  Service: Endoscopy;  Laterality: N/A;   COLONOSCOPY WITH PROPOFOL N/A 10/04/2019   Procedure: COLONOSCOPY WITH PROPOFOL;  Surgeon: Toledo, [name], MD;  Location: [facility] ENDOSCOPY;  Service: Gastroenterology;  Laterality: N/A;   CYST REMOVAL TRUNK  1977   ESOPHAGOGASTRODUODENOSCOPY (EGD) WITH PROPOFOL N/A 10/04/2019   Procedure: ESOPHAGOGASTRODUODENOSCOPY (EGD) WITH PROPOFOL;  Surgeon: Toledo, [name], MD;  Location: [facility] ENDOSCOPY;  Service: Gastroenterology;  Laterality: N/A;   LEFT HEART CATH AND CORONARY ANGIOGRAPHY Left 05/17/2019   Procedure: LEFT  HEART CATH AND CORONARY ANGIOGRAPHY;  Surgeon: Corey Skains, MD;  Location: [place] CV LAB;  Service: Cardiovascular;  Laterality: Left;   NECK SURGERY  2005   PROSTATE SURGERY      There were no vitals filed for this visit.   Subjective Assessment - 05/07/21 1722     Subjective Tired. Foot is hurting because he was on his feet all day yesterday and today. Forgot to take his meloxicam.  L heel is tender this afternoon, 4-5/10 currently    Pertinent History L Achilles Tendonitis. Pain began about 3 months ago. Gradually worsened. Had bone spurs in X-ray. MRI revealed no tears but a thickening of the tendon. Does not wear arch support. Also has a night boot and a CAM boot which have helped. Does not wear it if it rains. Also wears it occasionally. Wears CAM boot if he is going to be walking for a while.    Patient Stated Goals Stretch his Achilles out.    Currently in Pain? Yes    Pain Score 5     Pain Onset More than a month ago                                        PT Education - 05/07/21 1805  Education Details ther-ex    Person(s) Educated Patient    Methods Explanation;Demonstration;Tactile cues;Verbal cues    Comprehension Returned demonstration;Verbalized understanding           Objective          DM and HTN are both controlled per pt       Medbridge Access Code [email or web address]   Manual therapy Prone STM  using EDGE tool L Achillles tendon and calcaneous to decrease soft tissue restrictions   Prone STM L plantar foot to decrease fascial restrictions        Therapeutic exercise   Pt not wearing a CAM boot observed.    Prone L ankle PF isometrics 10x3 with 5 seconds   Prone manually resisted eccentric L ankle PF 10x3  Standing ankle DF/PF on rocker board 10 seconds. Uncomfortable.        Improved exercise technique, movement at target joints, use of target muscles after min to mod verbal, visual, tactile cues.       Response to treatment Pt tolerated session well without aggravation of Achilles pain.        Clinical impression Lighter session today secondary to pt still recovering from being sick as well as irritation of L heel. Continued with manual therapy to decrease soft tissue restrictions as well as providing proper tension to L achilles tendon to promote healing. Pt tolerated session well without aggravation of symptoms. Pt will benefit from continued skilled physical therapy services to decrease pain, improve strength and function.      PT Short Term Goals - 04/24/21 1152       PT SHORT TERM GOAL #1   Title Pt will be independent with his initial HEP to decrease pain, and improve ability to perform standing tasks more comfortably.    Baseline Pt has started his initial HEP (03/13/2021); 04/24/21: indep completing HEP ~ 3-4x/week    Time 3    Period Weeks    Status Achieved    Target Date 04/24/21               PT Long Term Goals - 05/05/21 1022       PT LONG TERM GOAL #1   Title Pt will have a decrease in L Achilles pain to 2/10 or less at worst to promote ability to ambulate, negotiate stairs, and perform standing tasks more comfortably.    Baseline 7/10 L achilles pain at worst for the past 2 months (03/13/2021); 04/24/21: 3/10 NPS with stairs, standing, ambulating.; 2/10 at most for the past 7 days (05/05/2021)    Time 4    Period Weeks    Status Achieved    Target Date 05/22/21      PT LONG TERM GOAL #2   Title Pt will improve B ankle DF AROM to at least 10 degrees to promote ability to ambulate and perform standing tasks more comfortably.    Baseline Ankle DF AROM 4 degrees R, 3 degrees L (03/13/2021); R/L ankle DF AROM: 10 deg/10 deg;  L ankle 11 degrees, R ankle 4 degreees (05/05/2021)    Time 4    Period Weeks    Status Partially Met    Target Date 05/22/21      PT LONG TERM GOAL #3   Title Pt will improve his FOTO score by at least 10 points as a demonstration of  improved function.    Baseline L foot FOTO 67 (03/13/2021); 04/24/21: 72; 72 (05/05/2021)    Time 4  Period Weeks    Status Partially Met    Target Date 05/22/21                   Plan - 05/07/21 1804     Clinical Impression Statement Lighter session today secondary to pt still recovering from being sick as well as irritation of L heel. Continued with manual therapy to decrease soft tissue restrictions as well as providing proper tension to L achilles tendon to promote healing. Pt tolerated session well without aggravation of symptoms. Pt will benefit from continued skilled physical therapy services to decrease pain, improve strength and function.    Personal Factors and Comorbidities Age;Comorbidity 3+;Time since onset of injury/illness/exacerbation;Fitness    Comorbidities CA, DM, HTN, obesity    Examination-Activity Limitations Stairs;Locomotion Level;Stand    Stability/Clinical Decision Making Stable/Uncomplicated    Rehab Potential Fair    PT Frequency 2x / week    PT Duration 4 weeks    PT Treatment/Interventions Therapeutic activities;Therapeutic exercise;Neuromuscular re-education;Patient/family education;Manual techniques;Dry needling;Aquatic Therapy;Electrical Stimulation;Iontophoresis 92m/ml Dexamethasone    PT Next Visit Plan Ankle DF AROM and lateral ankle strength    PT Home Exercise Plan Medbridge Access Code [email or web address]    [email or web address] Agree with Plan of Care Patient             Patient will benefit from skilled therapeutic intervention in order to improve the following deficits and impairments:  Pain, Improper body mechanics, Difficulty walking, Decreased strength, Decreased range of motion  Visit Diagnosis: Pain in Achilles tendon  Pain in left foot     Problem List Patient Active Problem List   Diagnosis Date Noted   Personal history of gout 11/13/2020   History of prostate cancer 10/13/2019   CKD (chronic kidney disease) stage 3, GFR 30-59 ml/min  ([place] 06/05/2019   Microvascular ischemia of myocardium 06/01/2019   Angina pectoris ([place] 05/10/2019   Encounter for general adult medical examination without abnormal findings 11/28/2018   Other obesity due to excess calories 07/26/2018   OSA on CPAP 11/30/2017   History of OCD (obsessive compulsive disorder) 06/01/2016   Mixed hypercholesterolemia and hypertriglyceridemia 04/20/2016   GERD without esophagitis 01/13/2016   Attention deficit disorder without hyperactivity 11/12/2015   Hypertension 05/14/2015   Diabetes mellitus type 2, uncontrolled, without complications 098/33/8250  Hypercalcemia 05/14/2015   Abdominal pain, LLQ (left lower quadrant) 04/23/2015   ED (erectile dysfunction) of organic origin 10/21/2012   Incomplete bladder emptying 10/21/2012   Malignant neoplasm of prostate ([place] 10/21/2012   Testicular hypofunction 10/21/2012   Bladder outlet obstruction 10/21/2012   MJoneen BoersPT, DPT  05/07/2021, 6:06 PM  [place] [facility] [address] [place] [address]Phone: [phone]  Fax:  [phone] Name: Scott ESQUIVELMRN: [id number] of Birth: 810/08/1950

## 2021-05-13 ENCOUNTER — Ambulatory Visit: Payer: Medicare Other

## 2021-05-15 ENCOUNTER — Ambulatory Visit: Payer: Medicare Other

## 2021-05-15 ENCOUNTER — Telehealth: Payer: Self-pay

## 2021-05-15 NOTE — Telephone Encounter (Signed)
No show last session 05/13/2021. Pt states that he is out of town in [place] until next Tuesday 05/20/21 morning. Will try to make it to his afternoon appointment next session. Has to cancel today's appointment. Pt was also informed of his Thursday 05/22/21 at 3 pm appointment. Pt verbalized understanding. Achilles has been doing fine until a while ago. Does his HEP which helps.  ?

## 2021-05-20 ENCOUNTER — Ambulatory Visit: Payer: Medicare Other | Attending: Podiatry

## 2021-05-20 ENCOUNTER — Telehealth: Payer: Self-pay

## 2021-05-20 DIAGNOSIS — M7660 Achilles tendinitis, unspecified leg: Secondary | ICD-10-CM | POA: Insufficient documentation

## 2021-05-20 DIAGNOSIS — M79672 Pain in left foot: Secondary | ICD-10-CM | POA: Insufficient documentation

## 2021-05-20 NOTE — Telephone Encounter (Signed)
No show. Called pt who said that he did not sleep for 24 hours. Was going to call but fell asleep. Will try to make it to his next appt Thursday 05/22/2021 at 3 pm ? ?

## 2021-05-20 NOTE — Telephone Encounter (Signed)
No show. Called pt who said that he did not sleep for 24 hours. Was going to call but fell asleep. Will try to make it to his next appt Thursday  ?

## 2021-05-22 ENCOUNTER — Other Ambulatory Visit: Payer: Self-pay

## 2021-05-22 ENCOUNTER — Ambulatory Visit: Payer: Medicare Other

## 2021-05-22 DIAGNOSIS — M79672 Pain in left foot: Secondary | ICD-10-CM

## 2021-05-22 DIAGNOSIS — M7660 Achilles tendinitis, unspecified leg: Secondary | ICD-10-CM

## 2021-05-22 NOTE — Therapy (Signed)
[facility] [address], [place], [address] Phone: [phone]   Fax:  [phone]  Physical Therapy Treatment  Patient Details  Name: Scott Skinner MRN: [id number] Date of Birth: 1950-11-04 Referring Provider (PT): [place], [place] T, [place]   Encounter Date: 05/22/2021   PT End of Session - 05/22/21 1505     Visit Number 12    Number of Visits 21    Date for PT Re-Evaluation 05/22/21    Authorization Type 2    Authorization Time Period 10    PT Start Time 1505    PT Stop Time 1548    PT Time Calculation (min) 43 min    Activity Tolerance Patient tolerated treatment well    Behavior During Therapy [facility] for tasks assessed/performed             Past Medical History:  Diagnosis Date   Cancer ([place])    prostate cancer    Chronic kidney disease    Complication of anesthesia    anaphylaxis    Diabetes mellitus without complication (HCC)    GERD (gastroesophageal reflux disease)    Gout    History of kidney stones    Hyperlipidemia    Hypertension    Microvascular ischemia of myocardium    Obesity    OSA (obstructive sleep apnea)    Prostate cancer ([place])     Past Surgical History:  Procedure Laterality Date   COLONOSCOPY WITH PROPOFOL N/A 10/05/2016   Procedure: COLONOSCOPY WITH PROPOFOL;  Surgeon: [name], MD;  Location: [facility] ENDOSCOPY;  Service: Endoscopy;  Laterality: N/A;   COLONOSCOPY WITH PROPOFOL N/A 10/04/2019   Procedure: COLONOSCOPY WITH PROPOFOL;  Surgeon: Toledo, [name], MD;  Location: [facility] ENDOSCOPY;  Service: Gastroenterology;  Laterality: N/A;   CYST REMOVAL TRUNK  1977   ESOPHAGOGASTRODUODENOSCOPY (EGD) WITH PROPOFOL N/A 10/04/2019   Procedure: ESOPHAGOGASTRODUODENOSCOPY (EGD) WITH PROPOFOL;  Surgeon: Toledo, [name], MD;  Location: [facility] ENDOSCOPY;  Service: Gastroenterology;  Laterality: N/A;   LEFT HEART CATH AND CORONARY ANGIOGRAPHY Left 05/17/2019   Procedure: LEFT  HEART CATH AND CORONARY ANGIOGRAPHY;  Surgeon: Corey Skains, MD;  Location: [facility];  Service: Cardiovascular;  Laterality: Left;   NECK SURGERY  2005   PROSTATE SURGERY      There were no vitals filed for this visit.   Subjective Assessment - 05/22/21 1506     Subjective L heel is a little bit tender. Did well. Did stretching with it. Stayed in check while at the trip. Acted up a little bit but it was not near what it had been. 2/10 L Achilles pain currently. Does not interrupt his sleeping anymore.    Pertinent History L Achilles Tendonitis. Pain began about 3 months ago. Gradually worsened. Had bone spurs in X-ray. MRI revealed no tears but a thickening of the tendon. Does not wear arch support. Also has a night boot and a CAM boot which have helped. Does not wear it if it rains. Also wears it occasionally. Wears CAM boot if he is going to be walking for a while.    Patient Stated Goals Stretch his Achilles out.    Currently in Pain? Yes    Pain Score 2     Pain Onset More than a month ago  PT Education - 05/22/21 1531     Education Details ther-ex    Person(s) Educated Patient    Methods Explanation;Demonstration;Tactile cues;Verbal cues    Comprehension Verbalized understanding;Returned demonstration           Objective          DM and HTN are both controlled per pt       Medbridge Access Code [email or web address]   Manual therapy Prone STM  using EDGE tool L Achillles tendon and calcaneous to decrease soft tissue restrictions   Prone STM L plantar foot to decrease fascial restrictions        Therapeutic exercise   Pt not wearing a CAM boot observed.   Reviewed POC: continue PT until the end of March 2023.   Standing with B UE assist   Eccentric heel lowering on first stair step 10x3   SLS on L LE with R tip toe assist   2 kg ball toss to trampoline 20 x 3  Gait with tip toe 32  ft x 2   Standing ankle DF/PF on rocker board 2 minutes  Supine ankle DF AROM 1x each LE         Improved exercise technique, movement at target joints, use of target muscles after min to mod verbal, visual, tactile cues.      Response to treatment Pt tolerated session well without aggravation of Achilles pain.        Clinical impression Pt demonstrates overall decreased L Achilles pain since initial evaluation. Continued working on decreasing soft tissue restrictions at Achilles, heel and plantar foot area as well as promoting proper stress to continue to promote healing. Pt tolerated session well without aggravation of symptoms. Pt will benefit from continued skilled physical therapy services to decrease pain, improve strength and function.       PT Short Term Goals - 04/24/21 1152       PT SHORT TERM GOAL #1   Title Pt will be independent with his initial HEP to decrease pain, and improve ability to perform standing tasks more comfortably.    Baseline Pt has started his initial HEP (03/13/2021); 04/24/21: indep completing HEP ~ 3-4x/week    Time 3    Period Weeks    Status Achieved    Target Date 04/24/21               PT Long Term Goals - 05/22/21 1508       PT LONG TERM GOAL #1   Title Pt will have a decrease in L Achilles pain to 2/10 or less at worst to promote ability to ambulate, negotiate stairs, and perform standing tasks more comfortably.    Baseline 7/10 L achilles pain at worst for the past 2 months (03/13/2021); 04/24/21: 3/10 NPS with stairs, standing, ambulating.; 2/10 at most for the past 7 days (05/05/2021); 3/10 at most for the past 7 days (05/22/2021)    Time 3    Period Weeks    Status Partially Met    Target Date 06/12/21      PT LONG TERM GOAL #2   Title Pt will improve B ankle DF AROM to at least 10 degrees to promote ability to ambulate and perform standing tasks more comfortably.    Baseline Ankle DF AROM 4 degrees R, 3 degrees L  (03/13/2021); R/L ankle DF AROM: 10 deg/10 deg;  L ankle 11 degrees, R ankle 4 degreees (05/05/2021); 9 degrees L, 8 degrees (05/22/2021)    Time 3  Period Weeks    Status Partially Met    Target Date 06/12/21      PT LONG TERM GOAL #3   Title Pt will improve his FOTO score by at least 10 points as a demonstration of improved function.    Baseline L foot FOTO 67 (03/13/2021); 04/24/21: 72; 72 (05/05/2021), (05/22/2021)    Time 3    Period Weeks    Status Partially Met    Target Date 06/12/21                   Plan - 05/22/21 1542     Clinical Impression Statement Pt demonstrates overall decreased L Achilles pain since initial evaluation. Continued working on decreasing soft tissue restrictions at Achilles, heel and plantar foot area as well as promoting proper stress to continue to promote healing. Pt tolerated session well without aggravation of symptoms. Pt will benefit from continued skilled physical therapy services to decrease pain, improve strength and function.    Personal Factors and Comorbidities Age;Comorbidity 3+;Time since onset of injury/illness/exacerbation;Fitness    Comorbidities CA, DM, HTN, obesity    Examination-Activity Limitations Stairs;Locomotion Level;Stand    Stability/Clinical Decision Making Stable/Uncomplicated    Clinical Decision Making Low    Rehab Potential Fair    PT Frequency 2x / week    PT Duration 4 weeks    PT Treatment/Interventions Therapeutic activities;Therapeutic exercise;Neuromuscular re-education;Patient/family education;Manual techniques;Dry needling;Aquatic Therapy;Electrical Stimulation;Iontophoresis 82m/ml Dexamethasone    PT Next Visit Plan Ankle DF AROM and lateral ankle strength    PT Home Exercise Plan Medbridge Access Code [email or web address]    [id number] Agree with Plan of Care Patient             Patient will benefit from skilled therapeutic intervention in order to improve the following deficits and impairments:  Pain,  Improper body mechanics, Difficulty walking, Decreased strength, Decreased range of motion  Visit Diagnosis: Pain in Achilles tendon - Plan: PT plan of care cert/re-cert  Pain in left foot - Plan: PT plan of care cert/re-cert     Problem List Patient Active Problem List   Diagnosis Date Noted   Personal history of gout 11/13/2020   History of prostate cancer 10/13/2019   CKD (chronic kidney disease) stage 3, GFR 30-59 ml/min ([place] 06/05/2019   Microvascular ischemia of myocardium 06/01/2019   Ang[place] pectoris ([place] 05/10/2019   Encounter for general adult medical examination without abnormal findings 11/28/2018   Other obesity due to excess calories 07/26/2018   OSA on CPAP 11/30/2017   History of OCD (obsessive compulsive disorder) 06/01/2016   Mixed hypercholesterolemia and hypertriglyceridemia 04/20/2016   GERD without esophagitis 01/13/2016   Attention deficit disorder without hyperactivity 11/12/2015   Hypertension 05/14/2015   Diabetes mellitus type 2, uncontrolled, without complications 028/00/3491  Hypercalcemia 05/14/2015   Abdominal pain, LLQ (left lower quadrant) 04/23/2015   ED (erectile dysfunction) of organic origin 10/21/2012   Incomplete bladder emptying 10/21/2012   Malignant neoplasm of prostate ([place] 10/21/2012   Testicular hypofunction 10/21/2012   Bladder outlet obstruction 10/21/2012     MJoneen BoersPT, DPT    05/22/2021, 4:00 PM  [facility] [address] [place] [address]Phone: [phone]  Fax:  [phone] Name: Scott JAQUITHMRN: [id number] of Birth: 826-Apr-1952

## 2021-05-27 ENCOUNTER — Ambulatory Visit: Payer: Medicare Other

## 2021-05-27 ENCOUNTER — Other Ambulatory Visit: Payer: Self-pay

## 2021-05-27 DIAGNOSIS — M7660 Achilles tendinitis, unspecified leg: Secondary | ICD-10-CM | POA: Diagnosis not present

## 2021-05-27 DIAGNOSIS — M79672 Pain in left foot: Secondary | ICD-10-CM

## 2021-05-27 NOTE — Therapy (Signed)
[facility] ?[address] ?[place], [place], [address] ?Phone: [phone]   Fax:  [phone] ? ?Physical Therapy Treatment ? ?Patient Details  ?Name: Scott Skinner ?MRN: [id number] ?Date of Birth: 1950-09-15 ?Referring Provider (PT): [place], [place] T, [place] ? ? ?Encounter Date: 05/27/2021 ? ? PT End of Session - 05/27/21 1433   ? ? Visit Number 13   ? Number of Visits 21   ? Date for PT Re-Evaluation 05/22/21   ? Authorization Type 3   ? Authorization Time Period 10   ? PT Start Time 7628   pt arrived late  ? PT Stop Time 3151   ? PT Time Calculation (min) 31 min   ? Activity Tolerance Patient tolerated treatment well   ? Behavior During Therapy [facility] for tasks assessed/performed   ? ?  ?  ? ?  ? ? ?Past Medical History:  ?Diagnosis Date  ? Cancer [facility])   ? prostate cancer   ? Chronic kidney disease   ? Complication of anesthesia   ? anaphylaxis   ? Diabetes mellitus without complication ([place])   ? GERD (gastroesophageal reflux disease)   ? Gout   ? History of kidney stones   ? Hyperlipidemia   ? Hypertension   ? Microvascular ischemia of myocardium   ? Obesity   ? OSA (obstructive sleep apnea)   ? Prostate cancer ([place])   ? ? ?Past Surgical History:  ?Procedure Laterality Date  ? COLONOSCOPY WITH PROPOFOL N/A 10/05/2016  ? Procedure: COLONOSCOPY WITH PROPOFOL;  Surgeon: [name], MD;  Location: [facility] ENDOSCOPY;  Service: Endoscopy;  Laterality: N/A;  ? COLONOSCOPY WITH PROPOFOL N/A 10/04/2019  ? Procedure: COLONOSCOPY WITH PROPOFOL;  Surgeon: Toledo, [name], MD;  Location: [facility] ENDOSCOPY;  Service: Gastroenterology;  Laterality: N/A;  ? CYST REMOVAL TRUNK  1977  ? ESOPHAGOGASTRODUODENOSCOPY (EGD) WITH PROPOFOL N/A 10/04/2019  ? Procedure: ESOPHAGOGASTRODUODENOSCOPY (EGD) WITH PROPOFOL;  Surgeon: Toledo, [name], MD;  Location: [facility] ENDOSCOPY;  Service: Gastroenterology;  Laterality: N/A;  ? LEFT HEART CATH AND CORONARY ANGIOGRAPHY Left 05/17/2019  ?  Procedure: LEFT HEART CATH AND CORONARY ANGIOGRAPHY;  Surgeon: Corey Skains, MD;  Location: [facility];  Service: Cardiovascular;  Laterality: Left;  ? NECK SURGERY  2005  ? PROSTATE SURGERY    ? ? ?There were no vitals filed for this visit. ? ? Subjective Assessment - 05/27/21 1440   ? ? Subjective L Achilles was about like last time. A little sore today because he was on his feet at the shop. About 2/10 currently   ? Pertinent History L Achilles Tendonitis. Pain began about 3 months ago. Gradually worsened. Had bone spurs in X-ray. MRI revealed no tears but a thickening of the tendon. Does not wear arch support. Also has a night boot and a CAM boot which have helped. Does not wear it if it rains. Also wears it occasionally. Wears CAM boot if he is going to be walking for a while.   ? Patient Stated Goals Stretch his Achilles out.   ? Currently in Pain? Yes   ? Pain Score 2    ? Pain Onset More than a month ago   ? ?  ?  ? ?  ? ? ? ? ? ? ? ? ? ? ? ? ? ? ? ? ? ? ? ? ? ? ? ? ? ? ? ? ? PT Education - 05/27/21 1521   ? ?  Education Details manual therapy   ? Person(s) Educated Patient   ? Methods Explanation   ? Comprehension Verbalized understanding   ? ?  ?  ? ?  ? ?Objective  ?  ?  ?  ?  ?DM and HTN are both controlled per pt ?  ?  ?  ?[place] Access Code [phone] ?  ?Manual therapy  ?Prone STM with and without EDGE tool L Achillles tendon and calcaneous to decrease soft tissue restrictions ?  ?Prone STM L soleus muscle to decrease muscle tension.  ? ?Prone STM L plantar foot to decrease fascial restrictions  ?  ?  ?  ?  ?Response to treatment ?Fair tolerance to today's session.  ?  ?  ?  ?Clinical impression ?Pt arrived late so session was adjusted accordingly. Focused more on manual therapy to decrease soft tissue restrictions secondary to time constraints. Improved fascial mobility at posterior leg, calcaneous, and plantar surface palpated after treatment. Fair tolerance to today's session. Pt  will benefit from continued skilled physical therapy services to decrease pain, improve strength and function.  ? ? ?  ? PT Short Term Goals - 04/24/21 1152   ? ?  ? PT SHORT TERM GOAL #1  ? Title Pt will be independent with his initial HEP to decrease pain, and improve ability to perform standing tasks more comfortably.   ? Baseline Pt has started his initial HEP (03/13/2021); 04/24/21: indep completing HEP ~ 3-4x/week   ? Time 3   ? Period Weeks   ? Status Achieved   ? Target Date 04/24/21   ? ?  ?  ? ?  ? ? ? ? PT Long Term Goals - 05/22/21 1508   ? ?  ? PT LONG TERM GOAL #1  ? Title Pt will have a decrease in L Achilles pain to 2/10 or less at worst to promote ability to ambulate, negotiate stairs, and perform standing tasks more comfortably.   ? Baseline 7/10 L achilles pain at worst for the past 2 months (03/13/2021); 04/24/21: 3/10 NPS with stairs, standing, ambulating.; 2/10 at most for the past 7 days (05/05/2021); 3/10 at most for the past 7 days (05/22/2021)   ? Time 3   ? Period Weeks   ? Status Partially Met   ? Target Date 06/12/21   ?  ? PT LONG TERM GOAL #2  ? Title Pt will improve B ankle DF AROM to at least 10 degrees to promote ability to ambulate and perform standing tasks more comfortably.   ? Baseline Ankle DF AROM 4 degrees R, 3 degrees L (03/13/2021); R/L ankle DF AROM: 10 deg/10 deg;  L ankle 11 degrees, R ankle 4 degreees (05/05/2021); 9 degrees L, 8 degrees (05/22/2021)   ? Time 3   ? Period Weeks   ? Status Partially Met   ? Target Date 06/12/21   ?  ? PT LONG TERM GOAL #3  ? Title Pt will improve his FOTO score by at least 10 points as a demonstration of improved function.   ? Baseline L foot FOTO 67 (03/13/2021); 04/24/21: 72; 72 (05/05/2021), (05/22/2021)   ? Time 3   ? Period Weeks   ? Status Partially Met   ? Target Date 06/12/21   ? ?  ?  ? ?  ? ? ? ? ? ? ? ? Plan - 05/27/21 1520   ? ? Clinical Impression Statement Pt arrived late so session was adjusted accordingly. Focused more on manual  therapy  to decrease soft tissue restrictions secondary to time constraints. Improved fascial mobility at posterior leg, calcaneous, and plantar surface palpated after treatment. Fair tolerance to today's session. Pt will benefit from continued skilled physical therapy services to decrease pain, improve strength and function.   ? Personal Factors and Comorbidities Age;Comorbidity 3+;Time since onset of injury/illness/exacerbation;Fitness   ? Comorbidities CA, DM, HTN, obesity   ? Examination-Activity Limitations Stairs;Locomotion Level;Stand   ? Stability/Clinical Decision Making Stable/Uncomplicated   ? Rehab Potential Fair   ? PT Frequency 2x / week   ? PT Duration 4 weeks   ? PT Treatment/Interventions Therapeutic activities;Therapeutic exercise;Neuromuscular re-education;Patient/family education;Manual techniques;Dry needling;Aquatic Therapy;Electrical Stimulation;Iontophoresis 20m/ml Dexamethasone   ? PT Next Visit Plan Ankle DF AROM and lateral ankle strength   ? [place]Access Code [phone]  ? Consulted and Agree with Plan of Care Patient   ? ?  ?  ? ?  ? ? ?Patient will benefit from skilled therapeutic intervention in order to improve the following deficits and impairments:  Pain, Improper body mechanics, Difficulty walking, Decreased strength, Decreased range of motion ? ?Visit Diagnosis: ?Pain in Achilles tendon ? ?Pain in left foot ? ? ? ? ?Problem List ?Patient Active Problem List  ? Diagnosis Date Noted  ? Personal history of gout 11/13/2020  ? History of prostate cancer 10/13/2019  ? CKD (chronic kidney disease) stage 3, GFR 30-59 ml/min (HCC) 06/05/2019  ? Microvascular ischemia of myocardium 06/01/2019  ? Angina pectoris ([place] 05/10/2019  ? Encounter for general adult medical examination without abnormal findings 11/28/2018  ? Other obesity due to excess calories 07/26/2018  ? OSA on CPAP 11/30/2017  ? History of OCD (obsessive compulsive disorder) 06/01/2016  ? Mixed  hypercholesterolemia and hypertriglyceridemia 04/20/2016  ? GERD without esophagitis 01/13/2016  ? Attention deficit disorder without hyperactivity 11/12/2015  ? Hypertension 05/14/2015  ? Diabetes mellitus type 2, un

## 2021-05-29 ENCOUNTER — Ambulatory Visit: Payer: Medicare Other

## 2021-05-29 ENCOUNTER — Other Ambulatory Visit: Payer: Self-pay

## 2021-05-29 DIAGNOSIS — M7660 Achilles tendinitis, unspecified leg: Secondary | ICD-10-CM | POA: Diagnosis not present

## 2021-05-29 NOTE — Therapy (Signed)
[facility] ?[address] ?[place], [place], [address] ?Phone: [phone]   Fax:  [phone] ? ?Physical Therapy Treatment ? ?Patient Details  ?Name: Scott Skinner ?MRN: [id number] ?Date of Birth: December 19, 1950 ?Referring Provider (PT): [place], [place] T, [place] ? ? ?Encounter Date: 05/29/2021 ? ? PT End of Session - 05/29/21 1502   ? ? Visit Number 14   ? Number of Visits 21   ? Date for PT Re-Evaluation 06/12/21   ? Authorization Type 4   ? Authorization Time Period 10   ? PT Start Time 1502   ? PT Stop Time 1542   ? PT Time Calculation (min) 40 min   ? Activity Tolerance Patient tolerated treatment well   ? Behavior During Therapy [facility] for tasks assessed/performed   ? ?  ?  ? ?  ? ? ?Past Medical History:  ?Diagnosis Date  ? Cancer [facility])   ? prostate cancer   ? Chronic kidney disease   ? Complication of anesthesia   ? anaphylaxis   ? Diabetes mellitus without complication ([place])   ? GERD (gastroesophageal reflux disease)   ? Gout   ? History of kidney stones   ? Hyperlipidemia   ? Hypertension   ? Microvascular ischemia of myocardium   ? Obesity   ? OSA (obstructive sleep apnea)   ? Prostate cancer ([place])   ? ? ?Past Surgical History:  ?Procedure Laterality Date  ? COLONOSCOPY WITH PROPOFOL N/A 10/05/2016  ? Procedure: COLONOSCOPY WITH PROPOFOL;  Surgeon: [name], MD;  Location: [facility] ENDOSCOPY;  Service: Endoscopy;  Laterality: N/A;  ? COLONOSCOPY WITH PROPOFOL N/A 10/04/2019  ? Procedure: COLONOSCOPY WITH PROPOFOL;  Surgeon: Toledo, [name], MD;  Location: [facility] ENDOSCOPY;  Service: Gastroenterology;  Laterality: N/A;  ? CYST REMOVAL TRUNK  1977  ? ESOPHAGOGASTRODUODENOSCOPY (EGD) WITH PROPOFOL N/A 10/04/2019  ? Procedure: ESOPHAGOGASTRODUODENOSCOPY (EGD) WITH PROPOFOL;  Surgeon: Toledo, [name], MD;  Location: [facility] ENDOSCOPY;  Service: Gastroenterology;  Laterality: N/A;  ? LEFT HEART CATH AND CORONARY ANGIOGRAPHY Left 05/17/2019  ? Procedure: LEFT  HEART CATH AND CORONARY ANGIOGRAPHY;  Surgeon: Corey Skains, MD;  Location: [facility] CV LAB;  Service: Cardiovascular;  Laterality: Left;  ? NECK SURGERY  2005  ? PROSTATE SURGERY    ? ? ?There were no vitals filed for this visit. ? ? Subjective Assessment - 05/29/21 1503   ? ? Subjective L Achilles is pretty much how it was the other day. Has not gotten worse, has not gotten any better. No pain currently. Was on his feet all day Tuesday and all day yesterday. Does not remember it bothering him Tuesday evening. Was a little sore last night. Took a meloxicam this morning. 2-3/10 at worst for the past 7 days.   ? Pertinent History L Achilles Tendonitis. Pain began about 3 months ago. Gradually worsened. Had bone spurs in X-ray. MRI revealed no tears but a thickening of the tendon. Does not wear arch support. Also has a night boot and a CAM boot which have helped. Does not wear it if it rains. Also wears it occasionally. Wears CAM boot if he is going to be walking for a while.   ? Patient Stated Goals Stretch his Achilles out.   ? Currently in Pain? No/denies   ? Pain Onset More than a month ago   ? ?  ?  ? ?  ? ? ? ? ? ? ? ? ? ? ? ? ? ? ? ? ? ? ? ? ? ? ? ? ? ? ? ? ?  PT Education - 05/29/21 1735   ? ? Education Details ther-ex   ? Person(s) Educated Patient   ? Methods Explanation;Demonstration;Tactile cues;Verbal cues   ? Comprehension Returned demonstration;Verbalized understanding   ? ?  ?  ? ?  ? ?  ?Objective  ?  ?  ?  ?  ?DM and HTN are both controlled per pt ?  ?  ?  ?[place] Access Code [phone] ?  ?Manual therapy ? ?Supine posterior glide L ankle to promote DF grade 3  ? ?Prone STM L soleus muscle to decrease muscle tension.  ? ?  ?Therapeutic exercise ? ?Prone glute max extension  ? R 10x2 ? ?Seated RTB inversion and eversion: 4x10 reps/plane of motion.  ? ?Standing with R foot on bosu ? 2 kg ball toss with varying directions 20x2 ? ?Forward step up onto bosu with B UE assist  ? L 10x ? Slight L  lateral Achilles discomfort, eases with rest.  ? ?Lateral step up onto Bosu with B UE assist  ? L 5x3 ? ?Standing ankle DF/PF on rockerboard x 2 min with B UE assist  ? ? ? ? ?Improved exercise technique, movement at target joints, use of target muscles after mod verbal, visual, tactile cues.  ? ?  ?  ?Response to treatment ?Pt tolerated session well without aggravation of symptoms.  ?  ?  ?  ?Clinical impression ?No starting pain level today. Continued working on decreasing soft tissue restrictions at the Achilles tendon area as well as decreasing L gastroc/soleus muscle tension. Worked on promoting gentle muscle tension to affected areas to promote healing. Pt tolerated session well without aggravation of symptoms. Pt will benefit from continued skilled physical therapy services to decrease pain, improve strength and function.  ? ?  ? ? ? ? ? PT Short Term Goals - 04/24/21 1152   ? ?  ? PT SHORT TERM GOAL #1  ? Title Pt will be independent with his initial HEP to decrease pain, and improve ability to perform standing tasks more comfortably.   ? Baseline Pt has started his initial HEP (03/13/2021); 04/24/21: indep completing HEP ~ 3-4x/week   ? Time 3   ? Period Weeks   ? Status Achieved   ? Target Date 04/24/21   ? ?  ?  ? ?  ? ? ? ? PT Long Term Goals - 05/22/21 1508   ? ?  ? PT LONG TERM GOAL #1  ? Title Pt will have a decrease in L Achilles pain to 2/10 or less at worst to promote ability to ambulate, negotiate stairs, and perform standing tasks more comfortably.   ? Baseline 7/10 L achilles pain at worst for the past 2 months (03/13/2021); 04/24/21: 3/10 NPS with stairs, standing, ambulating.; 2/10 at most for the past 7 days (05/05/2021); 3/10 at most for the past 7 days (05/22/2021)   ? Time 3   ? Period Weeks   ? Status Partially Met   ? Target Date 06/12/21   ?  ? PT LONG TERM GOAL #2  ? Title Pt will improve B ankle DF AROM to at least 10 degrees to promote ability to ambulate and perform standing tasks more  comfortably.   ? Baseline Ankle DF AROM 4 degrees R, 3 degrees L (03/13/2021); R/L ankle DF AROM: 10 deg/10 deg;  L ankle 11 degrees, R ankle 4 degreees (05/05/2021); 9 degrees L, 8 degrees (05/22/2021)   ? Time 3   ? Period Weeks   ?  Status Partially Met   ? Target Date 06/12/21   ?  ? PT LONG TERM GOAL #3  ? Title Pt will improve his FOTO score by at least 10 points as a demonstration of improved function.   ? Baseline L foot FOTO 67 (03/13/2021); 04/24/21: 72; 72 (05/05/2021), (05/22/2021)   ? Time 3   ? Period Weeks   ? Status Partially Met   ? Target Date 06/12/21   ? ?  ?  ? ?  ? ? ? ? ? ? ? ? Plan - 05/29/21 1501   ? ? Clinical Impression Statement No starting pain level today. Continued working on decreasing soft tissue restrictions at the Achilles tendon area as well as decreasing L gastroc/soleus muscle tension. Worked on promoting gentle muscle tension to affected areas to promote healing. Pt tolerated session well without aggravation of symptoms. Pt will benefit from continued skilled physical therapy services to decrease pain, improve strength and function.   ? Personal Factors and Comorbidities Age;Comorbidity 3+;Time since onset of injury/illness/exacerbation;Fitness   ? Comorbidities CA, DM, HTN, obesity   ? Examination-Activity Limitations Stairs;Locomotion Level;Stand   ? Stability/Clinical Decision Making Stable/Uncomplicated   ? Clinical Decision Making Low   ? Rehab Potential Fair   ? PT Frequency 2x / week   ? PT Duration 4 weeks   ? PT Treatment/Interventions Therapeutic activities;Therapeutic exercise;Neuromuscular re-education;Patient/family education;Manual techniques;Dry needling;Aquatic Therapy;Electrical Stimulation;Iontophoresis 22m/ml Dexamethasone   ? PT Next Visit Plan Ankle DF AROM and lateral ankle strength   ? PRandolphAccess Code [phone]  ? Consulted and Agree with Plan of Care Patient   ? ?  ?  ? ?  ? ? ?Patient will benefit from skilled therapeutic intervention  in order to improve the following deficits and impairments:  Pain, Improper body mechanics, Difficulty walking, Decreased strength, Decreased range of motion ? ?Visit Diagnosis: ?Pain in Achilles tendon

## 2021-06-02 ENCOUNTER — Ambulatory Visit: Payer: Medicare Other

## 2021-06-02 ENCOUNTER — Other Ambulatory Visit: Payer: Self-pay

## 2021-06-02 DIAGNOSIS — M7660 Achilles tendinitis, unspecified leg: Secondary | ICD-10-CM | POA: Diagnosis not present

## 2021-06-02 DIAGNOSIS — M79672 Pain in left foot: Secondary | ICD-10-CM

## 2021-06-02 NOTE — Therapy (Signed)
[facility] ?[address] ?[place], [place], [address] ?Phone: [phone]   Fax:  [phone] ? ?Physical Therapy Treatment ? ?Patient Details  ?Name: Scott Skinner ?MRN: [id number] ?Date of Birth: 12/05/1950 ?Referring Provider (PT): [place], [place] T, [place] ? ? ?Encounter Date: 06/02/2021 ? ? PT End of Session - 06/02/21 1422   ? ? Visit Number 15   ? Number of Visits 21   ? Date for PT Re-Evaluation 06/12/21   ? Authorization Type 5   ? Authorization Time Period 10   ? PT Start Time 6962   ? PT Stop Time 1501   ? PT Time Calculation (min) 39 min   ? Activity Tolerance Patient tolerated treatment well   ? Behavior During Therapy [facility] for tasks assessed/performed   ? ?  ?  ? ?  ? ? ?Past Medical History:  ?Diagnosis Date  ? Cancer [facility])   ? prostate cancer   ? Chronic kidney disease   ? Complication of anesthesia   ? anaphylaxis   ? Diabetes mellitus without complication ([place])   ? GERD (gastroesophageal reflux disease)   ? Gout   ? History of kidney stones   ? Hyperlipidemia   ? Hypertension   ? Microvascular ischemia of myocardium   ? Obesity   ? OSA (obstructive sleep apnea)   ? Prostate cancer ([place])   ? ? ?Past Surgical History:  ?Procedure Laterality Date  ? COLONOSCOPY WITH PROPOFOL N/A 10/05/2016  ? Procedure: COLONOSCOPY WITH PROPOFOL;  Surgeon: [name], MD;  Location: [facility] ENDOSCOPY;  Service: Endoscopy;  Laterality: N/A;  ? COLONOSCOPY WITH PROPOFOL N/A 10/04/2019  ? Procedure: COLONOSCOPY WITH PROPOFOL;  Surgeon: Toledo, [name], MD;  Location: [facility] ENDOSCOPY;  Service: Gastroenterology;  Laterality: N/A;  ? CYST REMOVAL TRUNK  1977  ? ESOPHAGOGASTRODUODENOSCOPY (EGD) WITH PROPOFOL N/A 10/04/2019  ? Procedure: ESOPHAGOGASTRODUODENOSCOPY (EGD) WITH PROPOFOL;  Surgeon: Toledo, [name], MD;  Location: [facility] ENDOSCOPY;  Service: Gastroenterology;  Laterality: N/A;  ? LEFT HEART CATH AND CORONARY ANGIOGRAPHY Left 05/17/2019  ? Procedure: LEFT  HEART CATH AND CORONARY ANGIOGRAPHY;  Surgeon: Corey Skains, MD;  Location: [facility] CV LAB;  Service: Cardiovascular;  Laterality: Left;  ? NECK SURGERY  2005  ? PROSTATE SURGERY    ? ? ?There were no vitals filed for this visit. ? ? Subjective Assessment - 06/02/21 1423   ? ? Subjective L achilles is a tiny bit sore on the bottom of the heel again today. Was a little sore after last session but was good that evening. Still sore after standing about 6 hours. 2-3/10 at worst for the past 7 days.   ? Pertinent History L Achilles Tendonitis. Pain began about 3 months ago. Gradually worsened. Had bone spurs in X-ray. MRI revealed no tears but a thickening of the tendon. Does not wear arch support. Also has a night boot and a CAM boot which have helped. Does not wear it if it rains. Also wears it occasionally. Wears CAM boot if he is going to be walking for a while.   ? Patient Stated Goals Stretch his Achilles out.   ? Currently in Pain? No/denies   unless he presses on his heel or stands for a while  ? Pain Onset More than a month ago   ? ?  ?  ? ?  ? ? ? ? ? ? ? ? ? ? ? ? ? ? ? ? ? ? ? ? ? ? ? ? ? ? ? ? ?  PT Education - 06/02/21 1458   ? ? Education Details ther-ex, HEP   ? Person(s) Educated Patient   ? Methods Explanation;Demonstration;Tactile cues;Verbal cues;Handout   ? Comprehension Returned demonstration;Verbalized understanding   ? ?  ?  ? ?  ? ?  ?Objective  ?  ?  ?  ?  ?DM and HTN are both controlled per pt ?  ?  ?  ?[place] Access Code [phone] ?  ? ?  ?Therapeutic exercise ?  ?Standing posture ? L foot prontation  ? ?Long sit  ?L foot IV red band 10x5 seconds for 3 sets ?L foot PF with IV blue band 10x3 with 5 second holds with eccentric return ? ?Seated L arch raises 10x5 seconds for 3 sets ? ?Standing L plantar foot stretch at wall. 20 seconds x 2 ? ?Standing ankle DF/PF on rockerboard x 3 min with B UE assist  ?  ? standing mini squats 10x ? ? ? ?  ?Improved exercise technique, movement at  target joints, use of target muscles after mod verbal, visual, tactile cues.  ?  ?  ?  ?Response to treatment ?Pt tolerated session well without aggravation of symptoms.  ?  ?  ?  ?Clinical impression ?Demonstrates L foot pronation posture in standing. Worked on L ankle IV, and PF/IV and intrinsic muscle strengthening to promote better positioning of his L calcaneous and decrease stress to his Achilles tendon. Pt tolerated session well without aggravation of symptoms. Pt will benefit from continued skilled physical therapy services to decrease pain, improve strength and function.  ? ? ? ? PT Short Term Goals - 04/24/21 1152   ? ?  ? PT SHORT TERM GOAL #1  ? Title Pt will be independent with his initial HEP to decrease pain, and improve ability to perform standing tasks more comfortably.   ? Baseline Pt has started his initial HEP (03/13/2021); 04/24/21: indep completing HEP ~ 3-4x/week   ? Time 3   ? Period Weeks   ? Status Achieved   ? Target Date 04/24/21   ? ?  ?  ? ?  ? ? ? ? PT Long Term Goals - 05/22/21 1508   ? ?  ? PT LONG TERM GOAL #1  ? Title Pt will have a decrease in L Achilles pain to 2/10 or less at worst to promote ability to ambulate, negotiate stairs, and perform standing tasks more comfortably.   ? Baseline 7/10 L achilles pain at worst for the past 2 months (03/13/2021); 04/24/21: 3/10 NPS with stairs, standing, ambulating.; 2/10 at most for the past 7 days (05/05/2021); 3/10 at most for the past 7 days (05/22/2021)   ? Time 3   ? Period Weeks   ? Status Partially Met   ? Target Date 06/12/21   ?  ? PT LONG TERM GOAL #2  ? Title Pt will improve B ankle DF AROM to at least 10 degrees to promote ability to ambulate and perform standing tasks more comfortably.   ? Baseline Ankle DF AROM 4 degrees R, 3 degrees L (03/13/2021); R/L ankle DF AROM: 10 deg/10 deg;  L ankle 11 degrees, R ankle 4 degreees (05/05/2021); 9 degrees L, 8 degrees (05/22/2021)   ? Time 3   ? Period Weeks   ? Status Partially Met   ? Target  Date 06/12/21   ?  ? PT LONG TERM GOAL #3  ? Title Pt will improve his FOTO score by at least 10 points as  a demonstration of improved function.   ? Baseline L foot FOTO 67 (03/13/2021); 04/24/21: 72; 72 (05/05/2021), (05/22/2021)   ? Time 3   ? Period Weeks   ? Status Partially Met   ? Target Date 06/12/21   ? ?  ?  ? ?  ? ? ? ? ? ? ? ? Plan - 06/02/21 1653   ? ? Clinical Impression Statement Demonstrates L foot pronation posture in standing. Worked on L ankle IV, and PF/IV and intrinsic muscle strengthening to promote better positioning of his L calcaneous and decrease stress to his Achilles tendon. Pt tolerated session well without aggravation of symptoms. Pt will benefit from continued skilled physical therapy services to decrease pain, improve strength and function.   ? Personal Factors and Comorbidities Age;Comorbidity 3+;Time since onset of injury/illness/exacerbation;Fitness   ? Comorbidities CA, DM, HTN, obesity   ? Examination-Activity Limitations Stairs;Locomotion Level;Stand   ? Stability/Clinical Decision Making Stable/Uncomplicated   ? Clinical Decision Making Low   ? Rehab Potential Fair   ? PT Frequency 2x / week   ? PT Duration 4 weeks   ? PT Treatment/Interventions Therapeutic activities;Therapeutic exercise;Neuromuscular re-education;Patient/family education;Manual techniques;Dry needling;Aquatic Therapy;Electrical Stimulation;Iontophoresis 96m/ml Dexamethasone   ? PT Next Visit Plan Ankle DF AROM and lateral ankle strength   ? PGarnetAccess Code [phone]  ? Consulted and Agree with Plan of Care Patient   ? ?  ?  ? ?  ? ? ?Patient will benefit from skilled therapeutic intervention in order to improve the following deficits and impairments:  Pain, Improper body mechanics, Difficulty walking, Decreased strength, Decreased range of motion ? ?Visit Diagnosis: ?Pain in Achilles tendon ? ?Pain in left foot ? ? ? ? ?Problem List ?Patient Active Problem List  ? Diagnosis Date Noted  ?  Personal history of gout 11/13/2020  ? History of prostate cancer 10/13/2019  ? CKD (chronic kidney disease) stage 3, GFR 30-59 ml/min (HCC) 06/05/2019  ? Microvascular ischemia of myocardium 06/01/18

## 2021-06-04 ENCOUNTER — Encounter: Payer: Self-pay | Admitting: Podiatry

## 2021-06-04 ENCOUNTER — Other Ambulatory Visit: Payer: Self-pay

## 2021-06-04 ENCOUNTER — Ambulatory Visit (INDEPENDENT_AMBULATORY_CARE_PROVIDER_SITE_OTHER): Payer: Medicare Other | Admitting: Podiatry

## 2021-06-04 DIAGNOSIS — M7662 Achilles tendinitis, left leg: Secondary | ICD-10-CM | POA: Diagnosis not present

## 2021-06-04 DIAGNOSIS — L603 Nail dystrophy: Secondary | ICD-10-CM

## 2021-06-04 MED ORDER — TERBINAFINE HCL 250 MG PO TABS: 250.0000 mg | ORAL_TABLET | Freq: Every day | ORAL | 0 refills | Status: DC

## 2021-06-04 NOTE — Progress Notes (Signed)
He presents today for follow-up of his Achilles tendinitis left he continues physical therapy states that after being on it for about 6 hours he started to get a little painful in the back.  He is referring to his left heel.  States that the toenail seems to be growing out very nicely he is happy with the use of the Lamisil he has completed 60 days of Lamisil.  Denies fever chills nausea vomiting muscle aches pains calf pain back pain chest pain shortness of breath rashes or itching. ? ?Objective: Vital signs are stable alert and oriented x3 his toenail has grown out significantly at this point it is 100% clear however the nail needs to continue to grow to the end of the the toe itself.  His Achilles is no longer thickened but still pinpoint tenderness right on the posterior aspect of the calcaneus. ? 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 ? ?Plan: At this point were going to give him another 30 days of Lamisil and I will follow-up with him in 1 month.  I am also going to asked that he start wearing his orthotics which we made for him several years ago asked him to bring those to me when I see him in 2 months. ?

## 2021-06-05 ENCOUNTER — Ambulatory Visit: Payer: Medicare Other

## 2021-06-05 DIAGNOSIS — M79672 Pain in left foot: Secondary | ICD-10-CM

## 2021-06-05 DIAGNOSIS — M7660 Achilles tendinitis, unspecified leg: Secondary | ICD-10-CM

## 2021-06-05 NOTE — Therapy (Signed)
?[facility] ?[address] ?[place], [place], [address] ?Phone: [phone]   Fax:  [phone] ? ?Physical Therapy Treatment ? ?Patient Details  ?Name: Scott Skinner ?MRN: [id number] ?Date of Birth: 1951/02/21 ?Referring Provider (PT): [place], [place], [place] ? ? ?Encounter Date: 06/05/2021 ? ? PT End of Session - 06/05/21 0940   ? ? Visit Number 16   ? Number of Visits 21   ? Date for PT Re-Evaluation 06/12/21   ? Authorization Type 6   ? Authorization Time Period 10   ? PT Start Time 0930   ? PT Stop Time 1014   ? PT Time Calculation (min) 44 min   ? Activity Tolerance Patient tolerated treatment well   ? Behavior During Therapy [facility] for tasks assessed/performed   ? ?  ?  ? ?  ? ? ?Past Medical History:  ?Diagnosis Date  ? Cancer [facility])   ? prostate cancer   ? Chronic kidney disease   ? Complication of anesthesia   ? anaphylaxis   ? Diabetes mellitus without complication ([place])   ? GERD (gastroesophageal reflux disease)   ? Gout   ? History of kidney stones   ? Hyperlipidemia   ? Hypertension   ? Microvascular ischemia of myocardium   ? Obesity   ? OSA (obstructive sleep apnea)   ? Prostate cancer ([place])   ? ? ?Past Surgical History:  ?Procedure Laterality Date  ? COLONOSCOPY WITH PROPOFOL N/A 10/05/2016  ? Procedure: COLONOSCOPY WITH PROPOFOL;  Surgeon: [name], MD;  Location: [facility] ENDOSCOPY;  Service: Endoscopy;  Laterality: N/A;  ? COLONOSCOPY WITH PROPOFOL N/A 10/04/2019  ? Procedure: COLONOSCOPY WITH PROPOFOL;  Surgeon: Toledo, [name], MD;  Location: [facility] ENDOSCOPY;  Service: Gastroenterology;  Laterality: N/A;  ? CYST REMOVAL TRUNK  1977  ? ESOPHAGOGASTRODUODENOSCOPY (EGD) WITH PROPOFOL N/A 10/04/2019  ? Procedure: ESOPHAGOGASTRODUODENOSCOPY (EGD) WITH PROPOFOL;  Surgeon: Toledo, [name], MD;  Location: [facility] ENDOSCOPY;  Service: Gastroenterology;  Laterality: N/A;  ? LEFT HEART CATH AND CORONARY ANGIOGRAPHY Left 05/17/2019  ? Procedure: LEFT  HEART CATH AND CORONARY ANGIOGRAPHY;  Surgeon: Corey Skains, MD;  Location: [facility];  Service: Cardiovascular;  Laterality: Left;  ? NECK SURGERY  2005  ? PROSTATE SURGERY    ? ? ?There were no vitals filed for this visit. ? ? Subjective Assessment - 06/05/21 0931   ? ? Subjective Pt reporting podiatrist wants him to wear orthotics consistently with pt reporting they have been helping. No more wearing CAM boot.   ? Pertinent History L Achilles Tendonitis. Pain began about 3 months ago. Gradually worsened. Had bone spurs in X-ray. MRI revealed no tears but a thickening of the tendon. Does not wear arch support. Also has a night boot and a CAM boot which have helped. Does not wear it if it rains. Also wears it occasionally. Wears CAM boot if he is going to be walking for a while.   ? Patient Stated Goals Stretch his Achilles out.   ? Currently in Pain? No/denies   ? Pain Onset More than a month ago   ? ?  ?  ? ?  ? ?There.ex: ? ?Sitting:  ?L foot IV red band 10x5 seconds for 3 sets ?L foot PF with IV blue band 10x3 with 5 second holds with eccentric return ?  ?Seated L arch raises 10x5 seconds for 3 sets. Initial need for multimodal cues for form/technique to  prevent hip, knee, and ankle compensation with good carryover after first set.  ?  ?Seated L foot toe curls on towel: 5 min total for toe flexor strength  ? ?Seated Toe flexor stretch on half bolster avoiding great toe due to bunion: 5x20 sec holds ? ?Standing ankle DF/PF on rockerboard 2x20 with  SUE assist. ?  ?Standing mini squats 2x10. Noted heel elevation at end range CKC ankle DF. Education provided on muscle tightness or joint hypomobility can contribute to limiting squat depth. Education provided on continuing gastroc mobility to improve tightness  ? ?Standing gastroc and soleus stretch on LLE: x30 sec/muscle bias ? ? PT Education - 06/05/21 0939   ? ? Education Details form/technique with exercise   ? Person(s) Educated Patient   ?  Methods Explanation;Demonstration   ? Comprehension Verbalized understanding;Returned demonstration   ? ?  ?  ? ?  ? ? ? PT Short Term Goals - 04/24/21 1152   ? ?  ? PT SHORT TERM GOAL #1  ? Title Pt will be independent with his initial HEP to decrease pain, and improve ability to perform standing tasks more comfortably.   ? Baseline Pt has started his initial HEP (03/13/2021); 04/24/21: indep completing HEP ~ 3-4x/week   ? Time 3   ? Period Weeks   ? Status Achieved   ? Target Date 04/24/21   ? ?  ?  ? ?  ? ? ? ? PT Long Term Goals - 05/22/21 1508   ? ?  ? PT LONG TERM GOAL #1  ? Title Pt will have a decrease in L Achilles pain to 2/10 or less at worst to promote ability to ambulate, negotiate stairs, and perform standing tasks more comfortably.   ? Baseline 7/10 L achilles pain at worst for the past 2 months (03/13/2021); 04/24/21: 3/10 NPS with stairs, standing, ambulating.; 2/10 at most for the past 7 days (05/05/2021); 3/10 at most for the past 7 days (05/22/2021)   ? Time 3   ? Period Weeks   ? Status Partially Met   ? Target Date 06/12/21   ?  ? PT LONG TERM GOAL #2  ? Title Pt will improve B ankle DF AROM to at least 10 degrees to promote ability to ambulate and perform standing tasks more comfortably.   ? Baseline Ankle DF AROM 4 degrees R, 3 degrees L (03/13/2021); R/L ankle DF AROM: 10 deg/10 deg;  L ankle 11 degrees, R ankle 4 degreees (05/05/2021); 9 degrees L, 8 degrees (05/22/2021)   ? Time 3   ? Period Weeks   ? Status Partially Met   ? Target Date 06/12/21   ?  ? PT LONG TERM GOAL #3  ? Title Pt will improve his FOTO score by at least 10 points as a demonstration of improved function.   ? Baseline L foot FOTO 67 (03/13/2021); 04/24/21: 72; 72 (05/05/2021), (05/22/2021)   ? Time 3   ? Period Weeks   ? Status Partially Met   ? Target Date 06/12/21   ? ?  ?  ? ?  ? ? ? ? ? ? ? ? Plan - 06/05/21 0940   ? ? Clinical Impression Statement Continuing PT POC with focus on intrinsic foot strengthening and ankle  strengthening to improve muscular support for pronated foot positioning of calcaneus to reduce achilles tension. Pt tolerating exercises without aggravation of achilles symptoms. Slightly limited in intrinsic foot mobility due to great toe bunion but otherwise no  issues. Pt will continue to benefit from skilled PT services to progress foot and ankle strength to prevent future pain and dysfunction with standing and gait ADL's.   ? Personal Factors and Comorbidities Age;Comorbidity 3+;Time since onset of injury/illness/exacerbation;Fitness   ? Comorbidities CA, DM, HTN, obesity   ? Examination-Activity Limitations Stairs;Locomotion Level;Stand   ? Stability/Clinical Decision Making Stable/Uncomplicated   ? Rehab Potential Fair   ? PT Frequency 2x / week   ? PT Duration 4 weeks   ? PT Treatment/Interventions Therapeutic activities;Therapeutic exercise;Neuromuscular re-education;Patient/family education;Manual techniques;Dry needling;Aquatic Therapy;Electrical Stimulation;Iontophoresis 22m/ml Dexamethasone   ? PT Next Visit Plan Ankle DF AROM and lateral ankle strength   ? [place] [phone]  ? Consulted and Agree with Plan of Care Patient   ? ?  ?  ? ?  ? ? ?Patient will benefit from skilled therapeutic intervention in order to improve the following deficits and impairments:  Pain, Improper body mechanics, Difficulty walking, Decreased strength, Decreased range of motion ? ?Visit Diagnosis: ?Pain in Achilles tendon ? ?Pain in left foot ? ? ? ? ?Problem List ?Patient Active Problem List  ? Diagnosis Date Noted  ? Personal history of gout 11/13/2020  ? History of prostate cancer 10/13/2019  ? CKD (chronic kidney disease) stage 3, GFR 30-59 ml/min (HCC) 06/05/2019  ? Microvascular ischemia of myocardium 06/01/2019  ? [place] 05/[place]  ? Encounter for general adult medical examination without abnormal findings 11/28/2018  ? Other obesity due to excess calories 07/26/2018   ? OSA on CPAP 11/30/2017  ? History of OCD (obsessive compulsive disorder) 06/01/2016  ? Mixed hypercholesterolemia and hypertriglyceridemia 04/20/2016  ? GERD without esophagitis 01/13/2016  ? Attention deficit diso

## 2021-06-09 ENCOUNTER — Ambulatory Visit: Payer: Medicare Other

## 2021-06-09 ENCOUNTER — Other Ambulatory Visit: Payer: Self-pay

## 2021-06-09 DIAGNOSIS — M7660 Achilles tendinitis, unspecified leg: Secondary | ICD-10-CM | POA: Diagnosis not present

## 2021-06-09 DIAGNOSIS — M79672 Pain in left foot: Secondary | ICD-10-CM

## 2021-06-09 NOTE — Therapy (Signed)
?OUTPATIENT PHYSICAL THERAPY TREATMENT NOTE ? ? ?Patient Name: Scott Skinner ?MRN: [id number] ?DOB:21-Nov-1950, 71 y.o., male ?Today's Date: 06/09/2021 ? ?PCP: [name], MD ?REFERRING PROVIDER: Garrel Ridgel, DPM ? ? PT End of Session - 06/09/21 1019   ? ? Visit Number 17   ? Number of Visits 21   ? Date for PT Re-Evaluation 06/12/21   ? Authorization Type 7   ? Authorization Time Period 10   ? PT Start Time 1019   ? PT Stop Time 1100   ? PT Time Calculation (min) 41 min   ? Activity Tolerance Patient tolerated treatment well   ? Behavior During Therapy [facility] for tasks assessed/performed   ? ?  ?  ? ?  ? ? ?Past Medical History:  ?Diagnosis Date  ? Cancer [facility])   ? prostate cancer   ? Chronic kidney disease   ? Complication of anesthesia   ? anaphylaxis   ? Diabetes mellitus without complication ([place])   ? GERD (gastroesophageal reflux disease)   ? Gout   ? History of kidney stones   ? Hyperlipidemia   ? Hypertension   ? Microvascular ischemia of myocardium   ? Obesity   ? OSA (obstructive sleep apnea)   ? Prostate cancer ([place])   ? ?Past Surgical History:  ?Procedure Laterality Date  ? COLONOSCOPY WITH PROPOFOL N/A 10/05/2016  ? Procedure: COLONOSCOPY WITH PROPOFOL;  Surgeon: [name], MD;  Location: [facility] ENDOSCOPY;  Service: Endoscopy;  Laterality: N/A;  ? COLONOSCOPY WITH PROPOFOL N/A 10/04/2019  ? Procedure: COLONOSCOPY WITH PROPOFOL;  Surgeon: Toledo, [name], MD;  Location: [facility] ENDOSCOPY;  Service: Gastroenterology;  Laterality: N/A;  ? CYST REMOVAL TRUNK  1977  ? ESOPHAGOGASTRODUODENOSCOPY (EGD) WITH PROPOFOL N/A 10/04/2019  ? Procedure: ESOPHAGOGASTRODUODENOSCOPY (EGD) WITH PROPOFOL;  Surgeon: Toledo, [name], MD;  Location: [facility] ENDOSCOPY;  Service: Gastroenterology;  Laterality: N/A;  ? LEFT HEART CATH AND CORONARY ANGIOGRAPHY Left 05/17/2019  ? Procedure: LEFT HEART CATH AND CORONARY ANGIOGRAPHY;  Surgeon: Corey Skains, MD;  Location: [facility];  Service: Cardiovascular;   Laterality: Left;  ? NECK SURGERY  2005  ? PROSTATE SURGERY    ? ?Patient Active Problem List  ? Diagnosis Date Noted  ? Personal history of gout 11/13/2020  ? History of prostate cancer 10/13/2019  ? CKD (chronic kidney disease) stage 3, GFR 30-59 ml/min (HCC) 06/05/2019  ? Microvascular ischemia of myocardium 06/01/2019  ? Angina pectoris ([place]) 05/10/2019  ? Encounter for general adult medical examination without abnormal findings 11/28/2018  ? Other obesity due to excess calories 07/26/2018  ? OSA on CPAP 11/30/2017  ? History of OCD (obsessive compulsive disorder) 06/01/2016  ? Mixed hypercholesterolemia and hypertriglyceridemia 04/20/2016  ? GERD without esophagitis 01/13/2016  ? Attention deficit disorder without hyperactivity 11/12/2015  ? Hypertension 05/14/2015  ? Diabetes mellitus type 2, uncontrolled, without complications [id number]  ? Hypercalcemia 05/14/2015  ? Abdominal pain, LLQ (left lower quadrant) 04/23/2015  ? ED (erectile dysfunction) of organic origin 10/21/2012  ? Incomplete bladder emptying 10/21/2012  ? Malignant neoplasm of prostate ([place]) 10/21/2012  ? Testicular hypofunction 10/21/2012  ? Bladder outlet obstruction 10/21/2012  ? ? ?REFERRING DIAG: M76.62 (ICD-10-CM) - Achilles tendinitis of left lower extremity ? ?THERAPY DIAG:  ?Pain in Achilles tendon ? ?Pain in left foot ? ?PERTINENT HISTORY: L Achilles Tendonitis. Pain began about 3 months ago. Gradually worsened. Had bone spurs in X-ray. MRI revealed no tears but a thickening of the tendon.  Does not wear arch support. Also has a night boot and a CAM boot which have helped. Does not wear it if it rains. Also wears it occasionally. Wears CAM boot if he is going to be walking for a while. ? ?PRECAUTIONS: No known precautions ? ?SUBJECTIVE: L Achilles is doing pretty good right now. Has been wearing his arch supports which is helping. Was on his feet the whole Saturday morning. Was just a little sore and that was it, about 2/10 pain.   ? ? ? ? ?PAIN:  ?Are you having pain? No ? ? ? ? ?TODAY'S TREATMENT:  ?  ?Objective  ?  ?  ?  ?  ?DM and HTN are both controlled per pt ?  ?  ?  ?[place] Access Code [phone] ?  ?  ?  ?Therapeutic exercise ?   ?Sitting:  ?L foot IV green band 10x5 seconds for 3 sets ? ?L foot PF with IV blue band 10x3 with 5 second holds with eccentric return ?  ?Seated L arch raises 10x5 seconds for 3 sets.  ? ?Seated L foot toe curls on towel 10x5 seconds  ? ?Seated Toe flexor stretch on half bolster avoiding great toe due to bunion: 3x30 sec holds ?  ? ?Standing gastroc and soleus stretch on LLE: x30 sec/muscle bias ? at wall ? ? ?Standing ankle DF/PF on rockerboard x 3 min with B UE assist  ?  ? standing mini squats 10x ?  ?  ?  ?  ?Improved exercise technique, movement at target joints, use of target muscles after mod verbal, visual, tactile cues.  ?  ?  ?  ?Response to treatment ?Pt tolerated session well without aggravation of symptoms.  ?  ?  ?  ?Clinical impression ?Pt making very good progress with decreased L Achilles pain with treatment as well as with recent addition of arch supports. Continued working on  L ankle IV, and PF/IV and intrinsic muscle strengthening to promote better positioning of his L calcaneous and decrease stress to his Achilles tendon. Pt tolerated session well without aggravation of symptoms. Pt will benefit from continued skilled physical therapy services to decrease pain, improve strength and function.  ? ? ? ? ? ? ?PATIENT EDUCATION: ?Education details: there-ex ?Person educated: Patient ?Education method: Explanation, Demonstration, Tactile cues, and Verbal cues ?Education comprehension: verbalized understanding and returned demonstration ? ? ?HOME EXERCISE PROGRAM: ?Access Code: [email or web address] ?URL: [email or web address] ?Date: 06/09/2021 ?Prepared by: Joneen Boers ? ?Exercises ?- Long Sitting Eccentric Ankle Plantar Flexion with Resistance  - 1 x daily - 7 x weekly - 1-3 sets - 10  reps ?- Standing Heel Raise with Support  - 1 x daily - 7 x weekly - 3 sets - 10 reps ?- Standing Ankle Dorsiflexion Stretch on Chair  - 1 x daily - 7 x weekly - 3 sets - 10 reps ?- Standing Ankle Dorsiflexion Stretch  - 1 x daily - 7 x weekly - 3 sets - 10 reps ?- Long Sitting Ankle Inversion with Resistance  - 1 x daily - 7 x weekly - 3 sets - 10 reps - 5 seconds hold ?- Seated Arch Lifts  - 1 x daily - 7 x weekly - 2-3 sets - 10 reps - 5 seconds hold ? ? ? ? ? PT Short Term Goals - 04/24/21 1152   ? ?  ? PT SHORT TERM GOAL #1  ? Title Pt will be independent with his initial HEP to decrease pain,  and improve ability to perform standing tasks more comfortably.   ? Baseline Pt has started his initial HEP (03/13/2021); 04/24/21: indep completing HEP ~ 3-4x/week   ? Time 3   ? Period Weeks   ? Status Achieved   ? Target Date 04/24/21   ? ?  ?  ? ?  ? ? ? PT Long Term Goals - 05/22/21 1508   ? ?  ? PT LONG TERM GOAL #1  ? Title Pt will have a decrease in L Achilles pain to 2/10 or less at worst to promote ability to ambulate, negotiate stairs, and perform standing tasks more comfortably.   ? Baseline 7/10 L achilles pain at worst for the past 2 months (03/13/2021); 04/24/21: 3/10 NPS with stairs, standing, ambulating.; 2/10 at most for the past 7 days (05/05/2021); 3/10 at most for the past 7 days (05/22/2021)   ? Time 3   ? Period Weeks   ? Status Partially Met   ? Target Date 06/12/21   ?  ? PT LONG TERM GOAL #2  ? Title Pt will improve B ankle DF AROM to at least 10 degrees to promote ability to ambulate and perform standing tasks more comfortably.   ? Baseline Ankle DF AROM 4 degrees R, 3 degrees L (03/13/2021); R/L ankle DF AROM: 10 deg/10 deg;  L ankle 11 degrees, R ankle 4 degreees (05/05/2021); 9 degrees L, 8 degrees (05/22/2021)   ? Time 3   ? Period Weeks   ? Status Partially Met   ? Target Date 06/12/21   ?  ? PT LONG TERM GOAL #3  ? Title Pt will improve his FOTO score by at least 10 points as a demonstration of  improved function.   ? Baseline L foot FOTO 67 (03/13/2021); 04/24/21: 72; 72 (05/05/2021), (05/22/2021)   ? Time 3   ? Period Weeks   ? Status Partially Met   ? Target Date 06/12/21   ? ?  ?  ? ?  ? ? ? Plan - 03/27

## 2021-06-12 ENCOUNTER — Ambulatory Visit: Payer: Medicare Other

## 2021-06-12 DIAGNOSIS — M7660 Achilles tendinitis, unspecified leg: Secondary | ICD-10-CM | POA: Diagnosis not present

## 2021-06-12 DIAGNOSIS — M79672 Pain in left foot: Secondary | ICD-10-CM

## 2021-06-12 NOTE — Therapy (Signed)
OUTPATIENT PHYSICAL THERAPY TREATMENT NOTE And Discharge Summary    Patient Name: Scott Skinner MRN: [id number] DOB:February 18, 1951, 71 y.o., male Today's Date: 06/12/2021  PCP: Marisue Ivan, MD REFERRING PROVIDER: Elinor Parkinson, DPM   PT End of Session - 06/12/21 [phone]     Visit Number 18    Number of Visits 21    Date for PT Re-Evaluation 06/12/21    Authorization Type 8    Authorization Time Period 10    PT Start Time [phone]    PT Stop Time 1006    PT Time Calculation (min) 30 min    Activity Tolerance Patient tolerated treatment well    Behavior During Therapy [facility] for tasks assessed/performed              Past Medical History:  Diagnosis Date   Cancer (HCC)    prostate cancer    Chronic kidney disease    Complication of anesthesia    anaphylaxis    Diabetes mellitus without complication (HCC)    GERD (gastroesophageal reflux disease)    Gout    History of kidney stones    Hyperlipidemia    Hypertension    Microvascular ischemia of myocardium    Obesity    OSA (obstructive sleep apnea)    Prostate cancer (HCC)    Past Surgical History:  Procedure Laterality Date   COLONOSCOPY WITH PROPOFOL N/A 10/05/2016   Procedure: COLONOSCOPY WITH PROPOFOL;  Surgeon: Christena Deem, MD;  Location: [facility] ENDOSCOPY;  Service: Endoscopy;  Laterality: N/A;   COLONOSCOPY WITH PROPOFOL N/A 10/04/2019   Procedure: COLONOSCOPY WITH PROPOFOL;  Surgeon: Toledo, Boykin Nearing, MD;  Location: [facility] ENDOSCOPY;  Service: Gastroenterology;  Laterality: N/A;   CYST REMOVAL TRUNK  1977   ESOPHAGOGASTRODUODENOSCOPY (EGD) WITH PROPOFOL N/A 10/04/2019   Procedure: ESOPHAGOGASTRODUODENOSCOPY (EGD) WITH PROPOFOL;  Surgeon: Toledo, Boykin Nearing, MD;  Location: [facility] ENDOSCOPY;  Service: Gastroenterology;  Laterality: N/A;   LEFT HEART CATH AND CORONARY ANGIOGRAPHY Left 05/17/2019   Procedure: LEFT HEART CATH AND CORONARY ANGIOGRAPHY;  Surgeon: Lamar Blinks, MD;  Location: [facility] INVASIVE CV LAB;   Service: Cardiovascular;  Laterality: Left;   NECK SURGERY  2005   PROSTATE SURGERY     Patient Active Problem List   Diagnosis Date Noted   Personal history of gout 11/13/2020   History of prostate cancer 10/13/2019   CKD (chronic kidney disease) stage 3, GFR 30-59 ml/min (HCC) 06/05/2019   Microvascular ischemia of myocardium 06/01/2019   Angina pectoris (HCC) 05/10/2019   Encounter for general adult medical examination without abnormal findings 11/28/2018   Other obesity due to excess calories 07/26/2018   OSA on CPAP 11/30/2017   History of OCD (obsessive compulsive disorder) 06/01/2016   Mixed hypercholesterolemia and hypertriglyceridemia 04/20/2016   GERD without esophagitis 01/13/2016   Attention deficit disorder without hyperactivity 11/12/2015   Hypertension 05/14/2015   Diabetes mellitus type 2, uncontrolled, without complications 05/14/2015   Hypercalcemia 05/14/2015   Abdominal pain, LLQ (left lower quadrant) 04/23/2015   ED (erectile dysfunction) of organic origin 10/21/2012   Incomplete bladder emptying 10/21/2012   Malignant neoplasm of prostate (HCC) 10/21/2012   Testicular hypofunction 10/21/2012   Bladder outlet obstruction 10/21/2012    REFERRING DIAG: M76.62 (ICD-10-CM) - Achilles tendinitis of left lower extremity  THERAPY DIAG:  Pain in Achilles tendon  Pain in left foot  PERTINENT HISTORY: L Achilles Tendonitis. Pain began about 3 months ago. Gradually worsened. Had bone spurs in X-ray. MRI revealed no tears but  a thickening of the tendon. Does not wear arch support. Also has a night boot and a CAM boot which have helped. Does not wear it if it rains. Also wears it occasionally. Wears CAM boot if he is going to be walking for a while.  PRECAUTIONS: No known precautions  SUBJECTIVE: L Achilles is doing pretty good. Was a little sore yesterday due to being on it all day. No pain to really mention of. Has gotten better. Feels like he is ready to graduate PT  today.     PAIN:  Are you having pain? No     TODAY'S TREATMENT:    Objective          DM and HTN are both controlled per pt       Medbridge Access Code [email or web address]       Therapeutic exercise    Supine B ankle DF AROM 1x each LE  Reviewed progress with PT towards goals    Sitting:   Seated L arch raises 10x5 seconds for 2 sets.   Seated L foot toe curls 10x5 seconds   Seated Toe flexor stretch on half bolster avoiding great toe due to bunion: 3x30 sec holds   Standing gastroc and soleus stretch on LLE: 3x30 sec/muscle bias each  at wall    standing mini squats 10x          Improved exercise technique, movement at target joints, use of target muscles after mod verbal, visual, tactile cues.        Response to treatment Pt tolerated session well without aggravation of symptoms.        Clinical impression Pt demonstrates significant decrease in pain, and improved function since initial evaluation. Pt has made very good progress with PT towards goals. Skilled physical therapy services discharged with patient continuing progress with exercises at home.        PATIENT EDUCATION: Education details: there-ex Person educated: Patient Education method: Explanation, Demonstration, Tactile cues, and Verbal cues Education comprehension: verbalized understanding and returned demonstration   HOME EXERCISE PROGRAM: Access Code: [email or web address] URL: [email or web address] Date: 06/09/2021 Prepared by: Loralyn Freshwater  Exercises - Long Sitting Eccentric Ankle Plantar Flexion with Resistance  - 1 x daily - 7 x weekly - 1-3 sets - 10 reps - Standing Heel Raise with Support  - 1 x daily - 7 x weekly - 3 sets - 10 reps - Standing Ankle Dorsiflexion Stretch on Chair  - 1 x daily - 7 x weekly - 3 sets - 10 reps - Standing Ankle Dorsiflexion Stretch  - 1 x daily - 7 x weekly - 3 sets - 10 reps - Long Sitting Ankle Inversion with Resistance  - 1 x daily - 7  x weekly - 3 sets - 10 reps - 5 seconds hold - Seated Arch Lifts  - 1 x daily - 7 x weekly - 2-3 sets - 10 reps - 5 seconds hold      PT Short Term Goals - 04/24/21 1152       PT SHORT TERM GOAL #1   Title Pt will be independent with his initial HEP to decrease pain, and improve ability to perform standing tasks more comfortably.    Baseline Pt has started his initial HEP (03/13/2021); 04/24/21: indep completing HEP ~ 3-4x/week    Time 3    Period Weeks    Status Achieved    Target Date 04/24/21  PT Long Term Goals - 06/12/21 0939       PT LONG TERM GOAL #1   Title Pt will have a decrease in L Achilles pain to 2/10 or less at worst to promote ability to ambulate, negotiate stairs, and perform standing tasks more comfortably.    Baseline 7/10 L achilles pain at worst for the past 2 months (03/13/2021); 04/24/21: 3/10 NPS with stairs, standing, ambulating.; 2/10 at most for the past 7 days (05/05/2021); 3/10 at most for the past 7 days (05/22/2021); 1.5/10 at worst for the past 7 days (06/12/2021)    Time 3    Period Weeks    Status Achieved    Target Date 06/12/21      PT LONG TERM GOAL #2   Title Pt will improve B ankle DF AROM to at least 10 degrees to promote ability to ambulate and perform standing tasks more comfortably.    Baseline Ankle DF AROM 4 degrees R, 3 degrees L (03/13/2021); R/L ankle DF AROM: 10 deg/10 deg;  L ankle 11 degrees, R ankle 4 degreees (05/05/2021); 9 degrees L, 8 degrees (05/22/2021); L 8 degrees, R 6 degrees (06/12/2021)    Time 3    Period Weeks    Status Partially Met    Target Date 06/12/21      PT LONG TERM GOAL #3   Title Pt will improve his FOTO score by at least 10 points as a demonstration of improved function.    Baseline L foot FOTO 67 (03/13/2021); 04/24/21: 72; 72 (05/05/2021), (05/22/2021); 77 (06/12/2021)    Time 3    Period Weeks    Status Achieved    Target Date 06/12/21              Plan - 06/12/21 1016     Clinical  Impression Statement Pt demonstrates significant decrease in pain, and improved function since initial evaluation. Pt has made very good progress with PT towards goals. Skilled physical therapy services discharged with patient continuing progress with exercises at home.    Personal Factors and Comorbidities --    Comorbidities CA, DM, HTN, obesity    Examination-Activity Limitations --    Stability/Clinical Decision Making --    Rehab Potential --    PT Frequency --    PT Duration --    PT Treatment/Interventions Therapeutic activities;Therapeutic exercise;Neuromuscular re-education;Patient/family education;Manual techniques    PT Next Visit Plan Continue progress with HEP    PT Home Exercise Plan Medbridge Access Code [email or web address]    Consulted and Agree with Plan of Care Patient             Thank you for your referral.   Loralyn Freshwater PT, DPT  06/12/2021, 10:20 AM

## 2021-08-06 ENCOUNTER — Ambulatory Visit (INDEPENDENT_AMBULATORY_CARE_PROVIDER_SITE_OTHER): Payer: Medicare Other | Admitting: Podiatry

## 2021-08-06 ENCOUNTER — Encounter: Payer: Self-pay | Admitting: Podiatry

## 2021-08-06 DIAGNOSIS — L603 Nail dystrophy: Secondary | ICD-10-CM

## 2021-08-06 MED ORDER — TERBINAFINE HCL 250 MG PO TABS: 250.0000 mg | ORAL_TABLET | Freq: Every day | ORAL | 0 refills | Status: AC

## 2021-08-06 NOTE — Progress Notes (Signed)
He presents today for follow-up of his Achilles tendinitis he states that it still hurts but most the time is in the back and has been with a lot of activity.  He says he continues to do stretching and taking his meloxicam.  Continues to wear his orthotics which she states are feeling very good at this point.  He states that his nail fungus is doing much better he has completed his 90 days of medication were going to continue medication 1 every other day.  Assessment: Healing onychomycosis resolving Achilles tendinitis left.  Plan: At this point we will start 1 tablet of Lamisil every other day.  He is going to continue his use of his orthotics and his meloxicam as well as the stretching.  He will follow-up with Aaron Edelman to consider new pair of orthotics.

## 2021-08-12 ENCOUNTER — Telehealth: Payer: Self-pay

## 2021-08-12 NOTE — Telephone Encounter (Signed)
2nd pair of foot orthotics ordered per patient request

## 2021-11-05 ENCOUNTER — Ambulatory Visit: Payer: Medicare Other | Admitting: Podiatry

## 2021-12-02 DIAGNOSIS — K429 Umbilical hernia without obstruction or gangrene: Secondary | ICD-10-CM | POA: Insufficient documentation

## 2021-12-02 HISTORY — DX: Umbilical hernia without obstruction or gangrene: K42.9

## 2022-02-13 DIAGNOSIS — F411 Generalized anxiety disorder: Secondary | ICD-10-CM | POA: Insufficient documentation

## 2022-02-13 HISTORY — DX: Generalized anxiety disorder: F41.1

## 2022-02-24 ENCOUNTER — Ambulatory Visit: Payer: Medicare Other | Attending: Sports Medicine

## 2022-02-24 DIAGNOSIS — M25521 Pain in right elbow: Secondary | ICD-10-CM | POA: Insufficient documentation

## 2022-02-24 DIAGNOSIS — M7711 Lateral epicondylitis, right elbow: Secondary | ICD-10-CM | POA: Diagnosis present

## 2022-02-24 NOTE — Therapy (Signed)
[place] [facility] [address], [place], [address] Phone: [phone]   Fax:  [phone]  Physical Therapy Evaluation  Patient Details  Name: Scott Skinner MRN: [id number] Date of Birth: 1950-08-16 Referring Provider (PT): Rosalia Hammers, DO   Encounter Date: 02/24/2022   PT End of Session - 02/24/22 1552     Visit Number 1    Number of Visits 13    Date for PT Re-Evaluation 04/09/22    PT Start Time 9767    PT Stop Time 1633    PT Time Calculation (min) 41 min    Activity Tolerance Patient tolerated treatment well    Behavior During Therapy [facility] for tasks assessed/performed             Past Medical History:  Diagnosis Date   Cancer ([place])    prostate cancer    Chronic kidney disease    Complication of anesthesia    anaphylaxis    Diabetes mellitus without complication ([place])    GERD (gastroesophageal reflux disease)    Gout    History of kidney stones    Hyperlipidemia    Hypertension    Microvascular ischemia of myocardium    Obesity    OSA (obstructive sleep apnea)    Prostate cancer ([place])     Past Surgical History:  Procedure Laterality Date   COLONOSCOPY WITH PROPOFOL N/A 10/05/2016   Procedure: COLONOSCOPY WITH PROPOFOL;  Surgeon: [name], MD;  Location: [facility] ENDOSCOPY;  Service: Endoscopy;  Laterality: N/A;   COLONOSCOPY WITH PROPOFOL N/A 10/04/2019   Procedure: COLONOSCOPY WITH PROPOFOL;  Surgeon: Toledo, [name], MD;  Location: [facility] ENDOSCOPY;  Service: Gastroenterology;  Laterality: N/A;   CYST REMOVAL TRUNK  1977   ESOPHAGOGASTRODUODENOSCOPY (EGD) WITH PROPOFOL N/A 10/04/2019   Procedure: ESOPHAGOGASTRODUODENOSCOPY (EGD) WITH PROPOFOL;  Surgeon: Toledo, [name], MD;  Location: [facility] ENDOSCOPY;  Service: Gastroenterology;  Laterality: N/A;   LEFT HEART CATH AND CORONARY ANGIOGRAPHY Left 05/17/2019   Procedure: LEFT HEART CATH AND CORONARY ANGIOGRAPHY;  Surgeon:  Corey Skains, MD;  Location: [place] CV LAB;  Service: Cardiovascular;  Laterality: Left;   NECK SURGERY  2005   PROSTATE SURGERY      There were no vitals filed for this visit.    Subjective Assessment - 02/24/22 1559     Subjective R lateral elbow: 4/10 currently (was 10/10 when seeing Dr. Candelaria Stagers), 9/10 at worst for the past 3 months.    Pertinent History R elbow tendinitis. Pain also travels up to his R shoulder (R C5/C6 dermatome). Pain calmed down since seeing Dr. Candelaria Stagers. Had x-rays with revealed a small spur but not in the area of pain.  Pain began spring of 2023 due to a lot of sanding, sawing, using a screw driver with his woodwork.  Pt is ambidextrous, but writes and eats with R hand.    Patient Stated Goals Decrease R elbow pain.    Currently in Pain? Yes    Pain Score 4     Pain Location Elbow    Pain Orientation Right    Pain Descriptors / Indicators Sharp;Shooting;Tender    Pain Type Chronic pain    Pain Radiating Towards R lateral amr and shoulder (around radial nerve pathway).    Pain Onset More than a month ago    Pain Frequency Occasional    Aggravating Factors  woodworking    Pain Relieving Factors Wrist flexor and extensor stretch, ice.  [facility] PT Assessment - 02/24/22 1555       Assessment   Medical Diagnosis R elbow pain, enthesopathy of R elbow, muscle spasm    Referring Provider (PT) Rosalia Hammers, DO    Onset Date/Surgical Date 01/15/22   Date PT referral sighned. Pain began Spring 2023   Hand Dominance Right;Left   Writes and eats with R hand     Precautions   Precaution Comments No known precautions      Restrictions   Other Position/Activity Restrictions No known restrictions      Balance Screen   Has the patient fallen in the past 6 months Yes    How many times? 2   Pt missed a step.   Has the patient had a decrease in activity level because of a fear of falling?  No    Is the patient reluctant to leave their  home because of a fear of falling?  No      AROM   Cervical Flexion limited with neck tightness    Cervical Extension limited with neck discomfort    Cervical - Right Side Bend limited with R neck discomfort    Cervical - Left Side Bend very limited with R upper trap, latearl shoulder discomfort (around C5/C6 dermatome)    Cervical - Right Rotation WFL    Cervical - Left Rotation [facility]      Strength   Overall Strength Comments No pain wiht resisted supination    Right Shoulder Flexion 4+/5    Right Shoulder ABduction 4-/5   with R lateral arm pain   Right Shoulder Internal Rotation 4/5    Right Shoulder External Rotation 4-/5    Right Elbow Flexion 4/5   with R elbow pain reproduction   Right Elbow Extension 4+/5    Right Wrist Flexion 4/5    Right Wrist Extension 4/5      Palpation   Palpation comment TTP R wrist extensor (extensor carpi radialis brevis and longus)   Good P to A and A to P mobility of R radial head.                       Objective measurements completed on examination: See above findings.           No latex band allergies  Pain with wrist extension with closed fist  Trigger point palpated R extensor carpi radialis longus and brevis with reproduction of symptoms. No pain with Lateral Epicondylitis test 3 but the muscles activated contained the active trigger points and area of symptoms.    Therapeutic exercise  Seated eccentric R wrist extension 10 lbs 7x  Then 6 lbs 10x  Improved exercise technique, movement at target joints, use of target muscles after mod verbal, visual, tactile cues.    Response to treatment Fair tolerance to today's session. Decreased R lateral elbow pain with isometric manual muscle testing and isometric resistance to R wrist and digit extensor muscles. Slight increase in symptoms towards the end of session following eccentric resisted extension exercises.     Clinical impression Pt is a 71 year old male who  came to physical therapy secondary to chronic R lateral elbow pain. Upon palpation, active trigger points were palpated at the extensor carpi radialis brevis and longus muscles, reproducing pain. No symptoms with the Lateral Epicondylitis test method 3 but the muscles activated contained the aforementioned trigger points. Slight reproduction of R lateral shoulder symptoms with L cervical side bending suggesting possible  cervical nerve involvement. Pt will benefit from skilled physical therapy services to address the aforementioned deficits as well as to promote ability to perform woodworking more comfortably.                         PT Short Term Goals - 02/24/22 1743       PT SHORT TERM GOAL #1   Title Pt will be independent with his initial HEP to improve strength, function, and decrease pain.    Time 3    Period Weeks    Status New    Target Date 03/19/22               PT Long Term Goals - 02/24/22 1743       PT LONG TERM GOAL #1   Title Pt will have a decrease in R lateral elbow pain to 4/10 or less at worst to promote ability to perform woodworking as well as use his R hand and wrist as well as perform chores such as mopping more comfortably.    Baseline 9/10 at worst for the past 3 months (02/24/2022)    Time 6    Period Weeks    Status New    Target Date 04/09/22      PT LONG TERM GOAL #2   Title Pt will improve his FOTO score by at least 10 points as a demonstration of improved function.    Baseline R elbow FOTO 61 (02/24/2022)    Time 6    Period Weeks    Status New    Target Date 04/09/22                    Plan - 02/24/22 1750     Clinical Impression Statement Pt is a 71 year old male who came to physical therapy secondary to chronic R lateral elbow pain. Upon palpation, active trigger points were palpated at the extensor carpi radialis brevis and longus muscles, reproducing pain. No symptoms with the Lateral Epicondylitis test  method 3 but the muscles activated contained the aforementioned trigger points. Slight reproduction of R lateral shoulder symptoms with L cervical side bending suggesting possible cervical nerve involvement. Pt will benefit from skilled physical therapy services to address the aforementioned deficits as well as to promote ability to perform woodworking more comfortably.    Personal Factors and Comorbidities Age;Comorbidity 3+;Time since onset of injury/illness/exacerbation    Comorbidities Prostate CA, kidney disease, DM, HTN    Examination-Activity Limitations Sleep;Other   moppoing, woodworking   Stability/Clinical Decision Making Stable/Uncomplicated    Clinical Decision Making Low    Rehab Potential Fair    PT Frequency 2x / week    PT Duration 6 weeks    PT Treatment/Interventions Manual techniques;Neuromuscular re-education;Therapeutic exercise;Therapeutic activities;Electrical Stimulation;Iontophoresis '4mg'$ /ml Dexamethasone;Patient/family education;Dry needling    PT Next Visit Plan isometric, concentric, eccentric contraction, manual techniques, modalities PRN    Consulted and Agree with Plan of Care Patient             Patient will benefit from skilled therapeutic intervention in order to improve the following deficits and impairments:  Pain, Postural dysfunction, Impaired UE functional use, Improper body mechanics, Decreased strength  Visit Diagnosis: Pain in right elbow - Plan: PT plan of care cert/re-cert  Lateral epicondylitis of right elbow - Plan: PT plan of care cert/re-cert     Problem List Patient Active Problem List   Diagnosis Date Noted   Personal history  of gout 11/13/2020   History of prostate cancer 10/13/2019   CKD (chronic kidney disease) stage 3, GFR 30-59 ml/min (HCC) 06/05/2019   Microvascular ischemia of myocardium 06/01/2019   Angina pectoris ([place]) 05/10/2019   Encounter for general adult medical examination without abnormal findings 11/28/2018    Other obesity due to excess calories 07/26/2018   OSA on CPAP 11/30/2017   History of OCD (obsessive compulsive disorder) 06/01/2016   Mixed hypercholesterolemia and hypertriglyceridemia 04/20/2016   GERD without esophagitis 01/13/2016   Attention deficit disorder without hyperactivity 11/12/2015   Hypertension 05/14/2015   Diabetes mellitus type 2, uncontrolled, without complications 13/88/7195   Hypercalcemia 05/14/2015   Abdominal pain, LLQ (left lower quadrant) 04/23/2015   ED (erectile dysfunction) of organic origin 10/21/2012   Incomplete bladder emptying 10/21/2012   Malignant neoplasm of prostate ([place]) 10/21/2012   Testicular hypofunction 10/21/2012   Bladder outlet obstruction 10/21/2012   Joneen Boers PT, DPT  02/24/2022, 6:02 PM  [place] [facility] [address], [place], [address] Phone: [phone]   Fax:  [phone]  Name: Scott Skinner MRN: [id number] Date of Birth: 14-Jul-1950

## 2022-02-26 ENCOUNTER — Ambulatory Visit: Payer: Medicare Other

## 2022-03-03 ENCOUNTER — Ambulatory Visit: Payer: Medicare Other

## 2022-03-03 DIAGNOSIS — M25521 Pain in right elbow: Secondary | ICD-10-CM | POA: Diagnosis not present

## 2022-03-03 DIAGNOSIS — M7711 Lateral epicondylitis, right elbow: Secondary | ICD-10-CM

## 2022-03-03 NOTE — Therapy (Signed)
OUTPATIENT PHYSICAL THERAPY TREATMENT NOTE   Patient Name: Scott Skinner MRN: [id number] DOB:[birth year], 71 y.o., male Today's Date: 03/03/2022  PCP: [name], MD  REFERRING PROVIDER: Diamond Nickel, DO   END OF SESSION:  PT End of Session - 03/03/22 1424     Visit Number 2    Number of Visits 13    Date for PT Re-Evaluation 04/09/22    PT Start Time 0350   pt arrived late   PT Stop Time 1503    PT Time Calculation (min) 39 min    Activity Tolerance Patient tolerated treatment well    Behavior During Therapy WFL for tasks assessed/performed             Past Medical History:  Diagnosis Date   Cancer ([place])    prostate cancer    Chronic kidney disease    Complication of anesthesia    anaphylaxis    Diabetes mellitus without complication (HCC)    GERD (gastroesophageal reflux disease)    Gout    History of kidney stones    Hyperlipidemia    Hypertension    Microvascular ischemia of myocardium    Obesity    OSA (obstructive sleep apnea)    Prostate cancer [facility])    Past Surgical History:  Procedure Laterality Date   COLONOSCOPY WITH PROPOFOL N/A 10/05/2016   Procedure: COLONOSCOPY WITH PROPOFOL;  Surgeon: [name], MD;  Location: [facility] ENDOSCOPY;  Service: Endoscopy;  Laterality: N/A;   COLONOSCOPY WITH PROPOFOL N/A 10/04/2019   Procedure: COLONOSCOPY WITH PROPOFOL;  Surgeon: Toledo, [name], MD;  Location: [facility] ENDOSCOPY;  Service: Gastroenterology;  Laterality: N/A;   CYST REMOVAL TRUNK  1977   ESOPHAGOGASTRODUODENOSCOPY (EGD) WITH PROPOFOL N/A 10/04/2019   Procedure: ESOPHAGOGASTRODUODENOSCOPY (EGD) WITH PROPOFOL;  Surgeon: Toledo, [name], MD;  Location: [facility] ENDOSCOPY;  Service: Gastroenterology;  Laterality: N/A;   LEFT HEART CATH AND CORONARY ANGIOGRAPHY Left 05/17/2019   Procedure: LEFT HEART CATH AND CORONARY ANGIOGRAPHY;  Surgeon: Corey Skains, MD;  Location: [place] [facility];  Service: Cardiovascular;  Laterality: Left;    NECK SURGERY  2005   PROSTATE SURGERY     Patient Active Problem List   Diagnosis Date Noted   Personal history of gout 11/13/2020   History of prostate cancer 10/13/2019   CKD (chronic kidney disease) stage 3, GFR 30-59 ml/min (HCC) 06/05/2019   Microvascular ischemia of myocardium 06/01/2019   Angina pectoris ([place]) 05/10/2019   Encounter for general adult medical examination without abnormal findings 11/28/2018   Other obesity due to excess calories 07/26/2018   OSA on CPAP 11/30/2017   History of OCD (obsessive compulsive disorder) 06/01/2016   Mixed hypercholesterolemia and hypertriglyceridemia 04/20/2016   GERD without esophagitis 01/13/2016   Attention deficit disorder without hyperactivity 11/12/2015   Hypertension 05/14/2015   Diabetes mellitus type 2, uncontrolled, without complications 09/38/1829   Hypercalcemia 05/14/2015   Abdominal pain, LLQ (left lower quadrant) 04/23/2015   ED (erectile dysfunction) of organic origin 10/21/2012   Incomplete bladder emptying 10/21/2012   Malignant neoplasm of prostate ([place]) 10/21/2012   Testicular hypofunction 10/21/2012   Bladder outlet obstruction 10/21/2012    REFERRING DIAG: R elbow pain, enthesopathy of R elbow, muscle spasm   THERAPY DIAG:  Pain in right elbow  Lateral epicondylitis of right elbow  Rationale for Evaluation and Treatment Rehabilitation  PERTINENT HISTORY: R elbow tendinitis. Pain also travels up to his R shoulder (R C5/C6 dermatome). Pain calmed down since seeing Dr. Candelaria Stagers.  Had x-rays with revealed a small spur but not in the area of pain. Pain began spring of 2023 due to a lot of sanding, sawing, using a screw driver with his woodwork. Pt is ambidextrous, but writes and eats with R hand.   PRECAUTIONS: No known precautions   SUBJECTIVE:   SUBJECTIVE STATEMENT: R arm stirred up a little bit last week the day after he did some sanding. Not really any pain currently    PAIN:  Are you having pain?  See subjective   TODAY'S TREATMENT:                                                                                                                                         DATE: 03/03/2022   Manual therapy Seated STM R wrist extensor muscles to decrease tension  Seated STM with Edge tool to promote blood flow to wrist extensor muscle complex    Therapeutic exercise   Seated isometric wrist extension in the extended position 10x5 seconds for 3 sets   Then in the neutral position 10x5 seconds for 2 sets   Then with PT, submax effort Seated isometric wrist extension in the extended position 10x5 seconds for 2 sets  Decreased R forward discomfort.      Improved exercise technique, movement at target joints, use of target muscles after mod verbal, visual, tactile cues.      Response to treatment Slight R lateral elbow/forearm discomfort after pt performed self resisted isometric wrist extension exercises. Decreased when performing at submax level instead.     Clinical impression  Active trigger point palpated at extensor carpi radialis longus and brevis muscles. Worked on STM to help decrease muscle tension. Utilized STM with edge tool to wrist extensor muscle complex to promote blood flow followed by isometric resistance to promote proper healing of affected tissues. Slight R lateral elbow/forearm discomfort after pt performed self resisted isometric wrist extension exercises. Decreased when performing at submax level instead. Pt will benefit from continued skilled physical therapy services to decrease pain, improve strength and function.          PATIENT EDUCATION: Education details: there-ex Person educated: Patient Education method: Explanation, Demonstration, Corporate treasurer cues, Verbal cues, and Handouts Education comprehension: verbalized understanding, returned demonstration, verbal cues required, and tactile cues required  HOME EXERCISE PROGRAM: Access Code:  [email or web address] URL: [email or web address] Date: 03/03/2022 Prepared by: Joneen Boers  Exercises - Isometric Wrist Extension Pronated  - 1 x daily - 7 x weekly - 3 sets - 10 reps - 5 seconds hold   PT Short Term Goals - 02/24/22 1743       PT SHORT TERM GOAL #1   Title Pt will be independent with his initial HEP to improve strength, function, and decrease pain.    Time 3    Period Weeks    Status New    Target Date 03/19/22  PT Long Term Goals - 02/24/22 1743       PT LONG TERM GOAL #1   Title Pt will have a decrease in R lateral elbow pain to 4/10 or less at worst to promote ability to perform woodworking as well as use his R hand and wrist as well as perform chores such as mopping more comfortably.    Baseline 9/10 at worst for the past 3 months (02/24/2022)    Time 6    Period Weeks    Status New    Target Date 04/09/22      PT LONG TERM GOAL #2   Title Pt will improve his FOTO score by at least 10 points as a demonstration of improved function.    Baseline R elbow FOTO 61 (02/24/2022)    Time 6    Period Weeks    Status New    Target Date 04/09/22              Plan - 03/03/22 1423     Clinical Impression Statement Active trigger point palpated at extensor carpi radialis longus and brevis muscles. Worked on STM to help decrease muscle tension. Utilized STM with edge tool to wrist extensor muscle complex to promote blood flow followed by isometric resistance to promote proper healing of affected tissues. Slight R lateral elbow forearm discomfort after pt performed self resisted isometric wrist extension exercises. Decreased when performing at submax level instead. Pt will benefit from continued skilled physical therapy services to decrease pain, improve strength and function.    Personal Factors and Comorbidities Age;Comorbidity 3+;Time since onset of injury/illness/exacerbation    Comorbidities Prostate CA, kidney disease, DM, HTN     Examination-Activity Limitations Sleep;Other   moppoing, woodworking   Stability/Clinical Decision Making Stable/Uncomplicated    Clinical Decision Making Low    Rehab Potential Fair    PT Frequency 2x / week    PT Duration 6 weeks    PT Treatment/Interventions Manual techniques;Neuromuscular re-education;Therapeutic exercise;Therapeutic activities;Electrical Stimulation;Iontophoresis '4mg'$ /ml Dexamethasone;Patient/family education;Dry needling    PT Next Visit Plan isometric, concentric, eccentric contraction, manual techniques, modalities PRN    Consulted and Agree with Plan of Care Patient              Joneen Boers PT, DPT  03/03/2022, 6:29 PM

## 2022-03-11 ENCOUNTER — Ambulatory Visit: Payer: Medicare Other

## 2022-03-11 DIAGNOSIS — M25521 Pain in right elbow: Secondary | ICD-10-CM | POA: Diagnosis not present

## 2022-03-11 DIAGNOSIS — M7711 Lateral epicondylitis, right elbow: Secondary | ICD-10-CM

## 2022-03-11 NOTE — Therapy (Signed)
OUTPATIENT PHYSICAL THERAPY TREATMENT NOTE   Patient Name: Scott Skinner MRN: [id number] DOB:Aug 14, 1950, 71 y.o., male Today's Date: 03/11/2022  PCP: [name], MD  REFERRING PROVIDER: Diamond Nickel, DO   END OF SESSION:  PT End of Session - 03/11/22 1632     Visit Number 3    Number of Visits 13    Date for PT Re-Evaluation 04/09/22    PT Start Time 1632    PT Stop Time 2542    PT Time Calculation (min) 42 min    Activity Tolerance Patient tolerated treatment well    Behavior During Therapy [facility] for tasks assessed/performed              Past Medical History:  Diagnosis Date   Cancer ([place])    prostate cancer    Chronic kidney disease    Complication of anesthesia    anaphylaxis    Diabetes mellitus without complication (HCC)    GERD (gastroesophageal reflux disease)    Gout    History of kidney stones    Hyperlipidemia    Hypertension    Microvascular ischemia of myocardium    Obesity    OSA (obstructive sleep apnea)    Prostate cancer ([place])    Past Surgical History:  Procedure Laterality Date   COLONOSCOPY WITH PROPOFOL N/A 10/05/2016   Procedure: COLONOSCOPY WITH PROPOFOL;  Surgeon: [name], MD;  Location: [facility] ENDOSCOPY;  Service: Endoscopy;  Laterality: N/A;   COLONOSCOPY WITH PROPOFOL N/A 10/04/2019   Procedure: COLONOSCOPY WITH PROPOFOL;  Surgeon: Toledo, [name], MD;  Location: [facility] ENDOSCOPY;  Service: Gastroenterology;  Laterality: N/A;   CYST REMOVAL TRUNK  1977   ESOPHAGOGASTRODUODENOSCOPY (EGD) WITH PROPOFOL N/A 10/04/2019   Procedure: ESOPHAGOGASTRODUODENOSCOPY (EGD) WITH PROPOFOL;  Surgeon: Toledo, [name], MD;  Location: [facility] ENDOSCOPY;  Service: Gastroenterology;  Laterality: N/A;   LEFT HEART CATH AND CORONARY ANGIOGRAPHY Left 05/17/2019   Procedure: LEFT HEART CATH AND CORONARY ANGIOGRAPHY;  Surgeon: Corey Skains, MD;  Location: [place] [facility];  Service: Cardiovascular;  Laterality: Left;   NECK  SURGERY  2005   PROSTATE SURGERY     Patient Active Problem List   Diagnosis Date Noted   Personal history of gout 11/13/2020   History of prostate cancer 10/13/2019   CKD (chronic kidney disease) stage 3, GFR 30-59 ml/min (HCC) 06/05/2019   Microvascular ischemia of myocardium 06/01/2019   Angina pectoris ([place]) 05/10/2019   Encounter for general adult medical examination without abnormal findings 11/28/2018   Other obesity due to excess calories 07/26/2018   OSA on CPAP 11/30/2017   History of OCD (obsessive compulsive disorder) 06/01/2016   Mixed hypercholesterolemia and hypertriglyceridemia 04/20/2016   GERD without esophagitis 01/13/2016   Attention deficit disorder without hyperactivity 11/12/2015   Hypertension 05/14/2015   Diabetes mellitus type 2, uncontrolled, without complications 70/62/3762   Hypercalcemia 05/14/2015   Abdominal pain, LLQ (left lower quadrant) 04/23/2015   ED (erectile dysfunction) of organic origin 10/21/2012   Incomplete bladder emptying 10/21/2012   Malignant neoplasm of prostate ([place]) 10/21/2012   Testicular hypofunction 10/21/2012   Bladder outlet obstruction 10/21/2012    REFERRING DIAG: R elbow pain, enthesopathy of R elbow, muscle spasm   THERAPY DIAG:  Pain in right elbow  Lateral epicondylitis of right elbow  Rationale for Evaluation and Treatment Rehabilitation  PERTINENT HISTORY: R elbow tendinitis. Pain also travels up to his R shoulder (R C5/C6 dermatome). Pain calmed down since seeing Dr. Candelaria Stagers. Had x-rays with  revealed a small spur but not in the area of pain. Pain began spring of 2023 due to a lot of sanding, sawing, using a screw driver with his woodwork. Pt is ambidextrous, but writes and eats with R hand.   PRECAUTIONS: No known precautions   SUBJECTIVE:   SUBJECTIVE STATEMENT: Last Thursday, pt stripped some wire which bothered his elbow when he twisted the wire. The isometric wrist extensions helped a little bit. Not  really any pain currently.    PAIN:  Are you having pain? See subjective   TODAY'S TREATMENT:                                                                                                                                         DATE: 03/11/2022   Manual therapy  Seated STM R extensor tendons to promote blood flow and decrease fascial restrictions    Modality: (unbilled) Dry needling performed to R wrist extensor muscles (primarily extensor carpi radialis brevis) to decrease pain and spasms along patient's forearm and lateral elbow region with patient in supine, R forearm on pillow utilizing 2 dry needle(s) 0.30 mm x 30 mm with 4 sticks total at wrist extensor muscles (primarily extensor carpi radialis brevis) . Patient educated about the risks and benefits from therapy and verbally consents to treatment.    Dry needling performed by Joneen Boers PT, DPT who is certified in this technique.      Therapeutic exercise   Seated submax isometric wrist extension in the extended position 10x5 seconds for 3 sets   Then in the neutral position 10x5 seconds for 2 sets   Wrist extensor stretch 30 seconds x 3 to continue to decrease tension.     Improved exercise technique, movement at target joints, use of target muscles after mod verbal, visual, tactile cues.      Response to treatment R forearm muscle soreness after treatment (normal).     Clinical impression  Worked on STM to improve blood flow and decrease soft tissue restrictions R wrist extensor origin, followed by submax isometric loading to promote healing. Utilized trigger point dry needling to R wrist extensor muscles, primarily his extensor carpi radialis brevis muscle to decrease excessive tension to R lateral elbow. R forearm muscle soreness (normal) reported after session.  Pt will benefit from continued skilled physical therapy services to decrease pain, improve strength and function.          PATIENT  EDUCATION: Education details: there-ex Person educated: Patient Education method: Explanation, Demonstration, Corporate treasurer cues, Verbal cues, and Handouts Education comprehension: verbalized understanding, returned demonstration, verbal cues required, and tactile cues required  HOME EXERCISE PROGRAM: Access Code: [email or web address] URL: [email or web address] Date: 03/03/2022 Prepared by: Joneen Boers  Exercises - Isometric Wrist Extension Pronated  - 1 x daily - 7 x weekly - 3 sets - 10 reps - 5 seconds hold   PT Short Term Goals -  02/24/22 1743       PT SHORT TERM GOAL #1   Title Pt will be independent with his initial HEP to improve strength, function, and decrease pain.    Time 3    Period Weeks    Status New    Target Date 03/19/22              PT Long Term Goals - 02/24/22 1743       PT LONG TERM GOAL #1   Title Pt will have a decrease in R lateral elbow pain to 4/10 or less at worst to promote ability to perform woodworking as well as use his R hand and wrist as well as perform chores such as mopping more comfortably.    Baseline 9/10 at worst for the past 3 months (02/24/2022)    Time 6    Period Weeks    Status New    Target Date 04/09/22      PT LONG TERM GOAL #2   Title Pt will improve his FOTO score by at least 10 points as a demonstration of improved function.    Baseline R elbow FOTO 61 (02/24/2022)    Time 6    Period Weeks    Status New    Target Date 04/09/22              Plan - 03/11/22 1632     Clinical Impression Statement Worked on STM to improve blood flow and decrease soft tissue restrictions R wrist extensor origin, followed by submax isometric loading to promote healing. Utilized trigger point dry needling to R wrist extensor muscles, primarily his extensor carpi radialis brevis muscle to decrease excessive tension to R lateral elbow. R forearm muscle soreness (normal) reported after session.  Pt will benefit from continued skilled  physical therapy services to decrease pain, improve strength and function.    Personal Factors and Comorbidities Age;Comorbidity 3+;Time since onset of injury/illness/exacerbation    Comorbidities Prostate CA, kidney disease, DM, HTN    Examination-Activity Limitations Sleep;Other   moppoing, woodworking   Stability/Clinical Decision Making Stable/Uncomplicated    Clinical Decision Making Low    Rehab Potential Fair    PT Frequency 2x / week    PT Duration 6 weeks    PT Treatment/Interventions Manual techniques;Neuromuscular re-education;Therapeutic exercise;Therapeutic activities;Electrical Stimulation;Iontophoresis '4mg'$ /ml Dexamethasone;Patient/family education;Dry needling    PT Next Visit Plan isometric, concentric, eccentric contraction, manual techniques, modalities PRN    Consulted and Agree with Plan of Care Patient               Joneen Boers PT, DPT  03/11/2022, 5:23 PM

## 2022-03-17 ENCOUNTER — Ambulatory Visit: Payer: Medicare Other | Attending: Sports Medicine

## 2022-03-17 DIAGNOSIS — M7711 Lateral epicondylitis, right elbow: Secondary | ICD-10-CM | POA: Insufficient documentation

## 2022-03-17 DIAGNOSIS — M25521 Pain in right elbow: Secondary | ICD-10-CM | POA: Diagnosis present

## 2022-03-17 NOTE — Therapy (Addendum)
OUTPATIENT PHYSICAL THERAPY TREATMENT NOTE   Patient Name: Scott Skinner MRN: [id number] DOB:04/03/1950, 72 y.o., male Today's Date: 03/17/2022  PCP: [name], MD  REFERRING PROVIDER: Diamond Nickel, DO   END OF SESSION:  PT End of Session - 03/17/22 1637     Visit Number 4    Number of Visits 13    Date for PT Re-Evaluation 04/09/22    PT Start Time 4098    PT Stop Time 1191    PT Time Calculation (min) 35 min    Activity Tolerance Patient tolerated treatment well    Behavior During Therapy [facility] for tasks assessed/performed               Past Medical History:  Diagnosis Date   Cancer ([place])    prostate cancer    Chronic kidney disease    Complication of anesthesia    anaphylaxis    Diabetes mellitus without complication (HCC)    GERD (gastroesophageal reflux disease)    Gout    History of kidney stones    Hyperlipidemia    Hypertension    Microvascular ischemia of myocardium    Obesity    OSA (obstructive sleep apnea)    Prostate cancer [facility])    Past Surgical History:  Procedure Laterality Date   COLONOSCOPY WITH PROPOFOL N/A 10/05/2016   Procedure: COLONOSCOPY WITH PROPOFOL;  Surgeon: [name], MD;  Location: [facility] ENDOSCOPY;  Service: Endoscopy;  Laterality: N/A;   COLONOSCOPY WITH PROPOFOL N/A 10/04/2019   Procedure: COLONOSCOPY WITH PROPOFOL;  Surgeon: Toledo, [name], MD;  Location: [facility] ENDOSCOPY;  Service: Gastroenterology;  Laterality: N/A;   CYST REMOVAL TRUNK  1977   ESOPHAGOGASTRODUODENOSCOPY (EGD) WITH PROPOFOL N/A 10/04/2019   Procedure: ESOPHAGOGASTRODUODENOSCOPY (EGD) WITH PROPOFOL;  Surgeon: Toledo, [name], MD;  Location: [facility] ENDOSCOPY;  Service: Gastroenterology;  Laterality: N/A;   LEFT HEART CATH AND CORONARY ANGIOGRAPHY Left 05/17/2019   Procedure: LEFT HEART CATH AND CORONARY ANGIOGRAPHY;  Surgeon: Corey Skains, MD;  Location: [place] CV LAB;  Service: Cardiovascular;  Laterality: Left;   NECK  SURGERY  2005   PROSTATE SURGERY     Patient Active Problem List   Diagnosis Date Noted   Personal history of gout 11/13/2020   History of prostate cancer 10/13/2019   CKD (chronic kidney disease) stage 3, GFR 30-59 ml/min (HCC) 06/05/2019   Microvascular ischemia of myocardium 06/01/2019   Angina pectoris ([place]) 05/10/2019   Encounter for general adult medical examination without abnormal findings 11/28/2018   Other obesity due to excess calories 07/26/2018   OSA on CPAP 11/30/2017   History of OCD (obsessive compulsive disorder) 06/01/2016   Mixed hypercholesterolemia and hypertriglyceridemia 04/20/2016   GERD without esophagitis 01/13/2016   Attention deficit disorder without hyperactivity 11/12/2015   Hypertension 05/14/2015   Diabetes mellitus type 2, uncontrolled, without complications 47/82/9562   Hypercalcemia 05/14/2015   Abdominal pain, LLQ (left lower quadrant) 04/23/2015   ED (erectile dysfunction) of organic origin 10/21/2012   Incomplete bladder emptying 10/21/2012   Malignant neoplasm of prostate ([place]) 10/21/2012   Testicular hypofunction 10/21/2012   Bladder outlet obstruction 10/21/2012    REFERRING DIAG: R elbow pain, enthesopathy of R elbow, muscle spasm   THERAPY DIAG:  Pain in right elbow  Lateral epicondylitis of right elbow  Rationale for Evaluation and Treatment Rehabilitation  PERTINENT HISTORY: R elbow tendinitis. Pain also travels up to his R shoulder (R C5/C6 dermatome). Pain calmed down since seeing Dr. Candelaria Stagers. Had x-rays  with revealed a small spur but not in the area of pain. Pain began spring of 2023 due to a lot of sanding, sawing, using a screw driver with his woodwork. Pt is ambidextrous, but writes and eats with R hand.   PRECAUTIONS: No known precautions   SUBJECTIVE:   SUBJECTIVE STATEMENT: R elbow has its moments. Had an elbow jerk reaction yesterday to grab something which bothered his elbow for a couple of minutes. 3/10 R elbow pain  currently. Was a little sore the next day after dry needling. Was not bad 2 days after last session.     PAIN:  Are you having pain? See subjective   TODAY'S TREATMENT:                                                                                                                                         DATE: 03/17/2022   Manual therapy  Seated STM R extensor tendons to promote blood flow and decrease fascial restrictions    Modality: (unbilled) Dry needling performed to R wrist extensor muscles (primarily extensor carpi radialis brevis) to decrease pain and spasms along patient's forearm and lateral elbow region with patient in supine, R forearm on pillow utilizing 2 dry needle(s) 0.30 mm x 30 mm with 4 sticks total at wrist extensor muscles (primarily extensor carpi radialis brevis) . Patient educated about the risks and benefits from therapy and verbally consents to treatment.    Dry needling performed by Joneen Boers PT, DPT who is certified in this technique.      Therapeutic exercise   Seated submax isometric wrist extension in the extended position 10x5 seconds for 3 sets   Then in the neutral position 10x5 seconds for 2 sets   Wrist extensor stretch 30 seconds x 3 to continue to decrease tension.   Improved exercise technique, movement at target joints, use of target muscles after min verbal, visual, tactile cues.      Response to treatment Fair tolerance to today's session.      Clinical impression   Continued working on STM to improve blood flow and decrease soft tissue restrictions R wrist extensor origin, followed by submax isometric loading to promote healing. Continued utilizing trigger point dry needling to R wrist extensor muscles, primarily his extensor carpi radialis brevis muscle to decrease excessive tension to R lateral elbow. Fair tolerance to today's session. Pt will benefit from continued skilled physical therapy services to decrease pain, improve  strength and function.          PATIENT EDUCATION: Education details: there-ex Person educated: Patient Education method: Explanation, Demonstration, Corporate treasurer cues, Verbal cues, and Handouts Education comprehension: verbalized understanding, returned demonstration, verbal cues required, and tactile cues required  HOME EXERCISE PROGRAM: Access Code: [email or web address] URL: [email or web address] Date: 03/03/2022 Prepared by: Joneen Boers  Exercises - Isometric Wrist Extension Pronated  - 1 x daily - 7 x weekly -  3 sets - 10 reps - 5 seconds hold   PT Short Term Goals - 02/24/22 1743       PT SHORT TERM GOAL #1   Title Pt will be independent with his initial HEP to improve strength, function, and decrease pain.    Time 3    Period Weeks    Status New    Target Date 03/19/22              PT Long Term Goals - 02/24/22 1743       PT LONG TERM GOAL #1   Title Pt will have a decrease in R lateral elbow pain to 4/10 or less at worst to promote ability to perform woodworking as well as use his R hand and wrist as well as perform chores such as mopping more comfortably.    Baseline 9/10 at worst for the past 3 months (02/24/2022)    Time 6    Period Weeks    Status New    Target Date 04/09/22      PT LONG TERM GOAL #2   Title Pt will improve his FOTO score by at least 10 points as a demonstration of improved function.    Baseline R elbow FOTO 61 (02/24/2022)    Time 6    Period Weeks    Status New    Target Date 04/09/22              Plan - 03/17/22 1636     Clinical Impression Statement Continued working on STM to improve blood flow and decrease soft tissue restrictions R wrist extensor origin, followed by submax isometric loading to promote healing. Continued utilizing trigger point dry needling to R wrist extensor muscles, primarily his extensor carpi radialis brevis muscle to decrease excessive tension to R lateral elbow. Fair tolerance to today's  session. Pt will benefit from continued skilled physical therapy services to decrease pain, improve strength and function.    Personal Factors and Comorbidities Age;Comorbidity 3+;Time since onset of injury/illness/exacerbation    Comorbidities Prostate CA, kidney disease, DM, HTN    Examination-Activity Limitations Sleep;Other   moppoing, woodworking   Stability/Clinical Decision Making Stable/Uncomplicated    Rehab Potential Fair    PT Frequency 2x / week    PT Duration 6 weeks    PT Treatment/Interventions Manual techniques;Neuromuscular re-education;Therapeutic exercise;Therapeutic activities;Electrical Stimulation;Iontophoresis '4mg'$ /ml Dexamethasone;Patient/family education;Dry needling    PT Next Visit Plan isometric, concentric, eccentric contraction, manual techniques, modalities PRN    Consulted and Agree with Plan of Care Patient               Joneen Boers PT, DPT  03/17/2022, 5:22 PM

## 2022-03-19 ENCOUNTER — Ambulatory Visit: Payer: Medicare Other

## 2022-03-24 ENCOUNTER — Ambulatory Visit: Payer: Medicare Other

## 2022-03-24 DIAGNOSIS — M25521 Pain in right elbow: Secondary | ICD-10-CM

## 2022-03-24 DIAGNOSIS — M7711 Lateral epicondylitis, right elbow: Secondary | ICD-10-CM

## 2022-03-24 NOTE — Therapy (Signed)
OUTPATIENT PHYSICAL THERAPY TREATMENT NOTE   Patient Name: Scott Skinner MRN: [id number] DOB:Jan 16, 1951, 72 y.o., male Today's Date: 03/24/2022  PCP: [name], MD  REFERRING PROVIDER: Diamond Nickel, DO   END OF SESSION:  PT End of Session - 03/24/22 1500     Visit Number 5    Number of Visits 13    Date for PT Re-Evaluation 04/09/22    PT Start Time 1501    PT Stop Time 2458    PT Time Calculation (min) 41 min    Activity Tolerance Patient tolerated treatment well    Behavior During Therapy WFL for tasks assessed/performed                Past Medical History:  Diagnosis Date   Cancer ([place])    prostate cancer    Chronic kidney disease    Complication of anesthesia    anaphylaxis    Diabetes mellitus without complication (HCC)    GERD (gastroesophageal reflux disease)    Gout    History of kidney stones    Hyperlipidemia    Hypertension    Microvascular ischemia of myocardium    Obesity    OSA (obstructive sleep apnea)    Prostate cancer [facility])    Past Surgical History:  Procedure Laterality Date   COLONOSCOPY WITH PROPOFOL N/A 10/05/2016   Procedure: COLONOSCOPY WITH PROPOFOL;  Surgeon: [name], MD;  Location: [facility] ENDOSCOPY;  Service: Endoscopy;  Laterality: N/A;   COLONOSCOPY WITH PROPOFOL N/A 10/04/2019   Procedure: COLONOSCOPY WITH PROPOFOL;  Surgeon: Toledo, [name], MD;  Location: [facility] ENDOSCOPY;  Service: Gastroenterology;  Laterality: N/A;   CYST REMOVAL TRUNK  1977   ESOPHAGOGASTRODUODENOSCOPY (EGD) WITH PROPOFOL N/A 10/04/2019   Procedure: ESOPHAGOGASTRODUODENOSCOPY (EGD) WITH PROPOFOL;  Surgeon: Toledo, [name], MD;  Location: [facility] ENDOSCOPY;  Service: Gastroenterology;  Laterality: N/A;   LEFT HEART CATH AND CORONARY ANGIOGRAPHY Left 05/17/2019   Procedure: LEFT HEART CATH AND CORONARY ANGIOGRAPHY;  Surgeon: Corey Skains, MD;  Location: [place] CV LAB;  Service: Cardiovascular;  Laterality: Left;   NECK  SURGERY  2005   PROSTATE SURGERY     Patient Active Problem List   Diagnosis Date Noted   Personal history of gout 11/13/2020   History of prostate cancer 10/13/2019   CKD (chronic kidney disease) stage 3, GFR 30-59 ml/min (HCC) 06/05/2019   Microvascular ischemia of myocardium 06/01/2019   Angina pectoris ([place]) 05/10/2019   Encounter for general adult medical examination without abnormal findings 11/28/2018   Other obesity due to excess calories 07/26/2018   OSA on CPAP 11/30/2017   History of OCD (obsessive compulsive disorder) 06/01/2016   Mixed hypercholesterolemia and hypertriglyceridemia 04/20/2016   GERD without esophagitis 01/13/2016   Attention deficit disorder without hyperactivity 11/12/2015   Hypertension 05/14/2015   Diabetes mellitus type 2, uncontrolled, without complications 09/98/3382   Hypercalcemia 05/14/2015   Abdominal pain, LLQ (left lower quadrant) 04/23/2015   ED (erectile dysfunction) of organic origin 10/21/2012   Incomplete bladder emptying 10/21/2012   Malignant neoplasm of prostate ([place]) 10/21/2012   Testicular hypofunction 10/21/2012   Bladder outlet obstruction 10/21/2012    REFERRING DIAG: R elbow pain, enthesopathy of R elbow, muscle spasm   THERAPY DIAG:  Pain in right elbow  Lateral epicondylitis of right elbow  Rationale for Evaluation and Treatment Rehabilitation  PERTINENT HISTORY: R elbow tendinitis. Pain also travels up to his R shoulder (R C5/C6 dermatome). Pain calmed down since seeing Dr. Candelaria Stagers. Had  x-rays with revealed a small spur but not in the area of pain. Pain began spring of 2023 due to a lot of sanding, sawing, using a screw driver with his woodwork. Pt is ambidextrous, but writes and eats with R hand.   PRECAUTIONS: No known precautions   SUBJECTIVE:   SUBJECTIVE STATEMENT: R elbow has been acting up. Can't pinpoint exactly what stirred it up. Did some biceps curls this morning which helped. No soreness currently. Felt  soreness over the weekend Did some woodworking, no sanding, used power tools. Used then TENS unit at home which helped a little.   PAIN:  Are you having pain? See subjective   TODAY'S TREATMENT:                                                                                                                                         DATE: 03/24/2022   Manual therapy Seated STM R extensor tendons using EDGE tool to promote blood flow and decrease fascial restrictions    Therapeutic exercise   Recommended counter force strap for pt if performing aggravating activities   Seated eccentric wrist extension 3 lbs 10x2  Then 5 lbs 10x  Seated resisted supination/pronation with hammer, holding at distal end 10x3 each direction   Standing R shoulder extension with scapular retraction red band 10x3 with 5 second holds   Wrist extensor stretch 30 seconds x 2 to continue to decrease tension.     Try  Finger extension spreads yellow band/rubber band next visit if appropriate    Improved exercise technique, movement at target joints, use of target muscles after min verbal, visual, tactile cues.      Response to treatment Pt tolerated session well without aggravation of symptoms.    Clinical impression Utilized edge tool with soft tissue mobilization today to promote blood flow and decrease fascial restrictions. Followed manual therapy with eccentric loading to wrist extensor muscles to promote strength and healing. Added scapular strengthening to promote proper foundation for wrist extensor muscle to work on when pt using his R UE for tasks such as woodworking. Pt tolerated session well without aggravation of symptoms.  Pt will benefit from continued skilled physical therapy services to decrease pain, improve strength and function.          PATIENT EDUCATION: Education details: there-ex Person educated: Patient Education method: Explanation, Demonstration, Corporate treasurer cues, Verbal cues,  and Handouts Education comprehension: verbalized understanding, returned demonstration, verbal cues required, and tactile cues required  HOME EXERCISE PROGRAM: Access Code: [email or web address] URL: [email or web address] Date: 03/03/2022 Prepared by: Joneen Boers  Exercises - Isometric Wrist Extension Pronated  - 1 x daily - 7 x weekly - 3 sets - 10 reps - 5 seconds hold - Seated Eccentric Wrist Extension  - 3 x daily - 7 x weekly - 1 sets - 10 reps   5 lbs   PT Short Term Goals -  02/24/22 1743       PT SHORT TERM GOAL #1   Title Pt will be independent with his initial HEP to improve strength, function, and decrease pain.    Time 3    Period Weeks    Status New    Target Date 03/19/22              PT Long Term Goals - 02/24/22 1743       PT LONG TERM GOAL #1   Title Pt will have a decrease in R lateral elbow pain to 4/10 or less at worst to promote ability to perform woodworking as well as use his R hand and wrist as well as perform chores such as mopping more comfortably.    Baseline 9/10 at worst for the past 3 months (02/24/2022)    Time 6    Period Weeks    Status New    Target Date 04/09/22      PT LONG TERM GOAL #2   Title Pt will improve his FOTO score by at least 10 points as a demonstration of improved function.    Baseline R elbow FOTO 61 (02/24/2022)    Time 6    Period Weeks    Status New    Target Date 04/09/22              Plan - 03/24/22 1458     Clinical Impression Statement Utilized edge tool with soft tissue mobilization today to promote blood flow and decrease fascial restrictions. Followed manual therapy with eccentric loading to wrist extensor muscles to promote strength and healing. Added scapular strengthening to promote proper foundation for wrist extensor muscle to work on when pt using his R UE for tasks such as woodworking. Pt tolerated session well without aggravation of symptoms.  Pt will benefit from continued skilled physical  therapy services to decrease pain, improve strength and function.    Personal Factors and Comorbidities Age;Comorbidity 3+;Time since onset of injury/illness/exacerbation    Comorbidities Prostate CA, kidney disease, DM, HTN    Examination-Activity Limitations Sleep;Other   moppoing, woodworking   Stability/Clinical Decision Making Stable/Uncomplicated    Clinical Decision Making Low    Rehab Potential Fair    PT Frequency 2x / week    PT Duration 6 weeks    PT Treatment/Interventions Manual techniques;Neuromuscular re-education;Therapeutic exercise;Therapeutic activities;Electrical Stimulation;Iontophoresis '4mg'$ /ml Dexamethasone;Patient/family education;Dry needling    PT Next Visit Plan isometric, concentric, eccentric contraction, manual techniques, modalities PRN    Consulted and Agree with Plan of Care Patient               Joneen Boers PT, DPT  03/24/2022, 3:49 PM

## 2022-03-26 ENCOUNTER — Telehealth: Payer: Self-pay

## 2022-03-26 ENCOUNTER — Ambulatory Visit: Payer: Medicare Other

## 2022-03-26 NOTE — Telephone Encounter (Signed)
No show. Called pt who said he thought it was at 3:45 pm today. Will be able to make it to his next follow up session.

## 2022-03-31 ENCOUNTER — Ambulatory Visit: Payer: Medicare Other

## 2022-03-31 DIAGNOSIS — M25521 Pain in right elbow: Secondary | ICD-10-CM | POA: Diagnosis not present

## 2022-03-31 DIAGNOSIS — M7711 Lateral epicondylitis, right elbow: Secondary | ICD-10-CM

## 2022-03-31 NOTE — Therapy (Signed)
OUTPATIENT PHYSICAL THERAPY TREATMENT NOTE   Patient Name: Scott Skinner MRN: [id number] DOB:1950-09-05, 72 y.o., male Today's Date: 03/31/2022  PCP: [name], MD  REFERRING PROVIDER: Diamond Nickel, DO   END OF SESSION:  PT End of Session - 03/31/22 1419     Visit Number 6    Number of Visits 13    Date for PT Re-Evaluation 04/09/22    PT Start Time 5643    PT Stop Time 1500    PT Time Calculation (min) 40 min    Activity Tolerance Patient tolerated treatment well    Behavior During Therapy WFL for tasks assessed/performed                 Past Medical History:  Diagnosis Date   Cancer ([place])    prostate cancer    Chronic kidney disease    Complication of anesthesia    anaphylaxis    Diabetes mellitus without complication (HCC)    GERD (gastroesophageal reflux disease)    Gout    History of kidney stones    Hyperlipidemia    Hypertension    Microvascular ischemia of myocardium    Obesity    OSA (obstructive sleep apnea)    Prostate cancer [facility])    Past Surgical History:  Procedure Laterality Date   COLONOSCOPY WITH PROPOFOL N/A 10/05/2016   Procedure: COLONOSCOPY WITH PROPOFOL;  Surgeon: [name], MD;  Location: [facility] ENDOSCOPY;  Service: Endoscopy;  Laterality: N/A;   COLONOSCOPY WITH PROPOFOL N/A 10/04/2019   Procedure: COLONOSCOPY WITH PROPOFOL;  Surgeon: Toledo, [name], MD;  Location: [facility] ENDOSCOPY;  Service: Gastroenterology;  Laterality: N/A;   CYST REMOVAL TRUNK  1977   ESOPHAGOGASTRODUODENOSCOPY (EGD) WITH PROPOFOL N/A 10/04/2019   Procedure: ESOPHAGOGASTRODUODENOSCOPY (EGD) WITH PROPOFOL;  Surgeon: Toledo, [name], MD;  Location: [facility] ENDOSCOPY;  Service: Gastroenterology;  Laterality: N/A;   LEFT HEART CATH AND CORONARY ANGIOGRAPHY Left 05/17/2019   Procedure: LEFT HEART CATH AND CORONARY ANGIOGRAPHY;  Surgeon: Corey Skains, MD;  Location: [place] CV LAB;  Service: Cardiovascular;  Laterality: Left;   NECK  SURGERY  2005   PROSTATE SURGERY     Patient Active Problem List   Diagnosis Date Noted   Personal history of gout 11/13/2020   History of prostate cancer 10/13/2019   CKD (chronic kidney disease) stage 3, GFR 30-59 ml/min (HCC) 06/05/2019   Microvascular ischemia of myocardium 06/01/2019   Angina pectoris ([place]) 05/10/2019   Encounter for general adult medical examination without abnormal findings 11/28/2018   Other obesity due to excess calories 07/26/2018   OSA on CPAP 11/30/2017   History of OCD (obsessive compulsive disorder) 06/01/2016   Mixed hypercholesterolemia and hypertriglyceridemia 04/20/2016   GERD without esophagitis 01/13/2016   Attention deficit disorder without hyperactivity 11/12/2015   Hypertension 05/14/2015   Diabetes mellitus type 2, uncontrolled, without complications 32/95/1884   Hypercalcemia 05/14/2015   Abdominal pain, LLQ (left lower quadrant) 04/23/2015   ED (erectile dysfunction) of organic origin 10/21/2012   Incomplete bladder emptying 10/21/2012   Malignant neoplasm of prostate ([place]) 10/21/2012   Testicular hypofunction 10/21/2012   Bladder outlet obstruction 10/21/2012    REFERRING DIAG: R elbow pain, enthesopathy of R elbow, muscle spasm   THERAPY DIAG:  Pain in right elbow  Lateral epicondylitis of right elbow  Rationale for Evaluation and Treatment Rehabilitation  PERTINENT HISTORY: R elbow tendinitis. Pain also travels up to his R shoulder (R C5/C6 dermatome). Pain calmed down since seeing Dr. Candelaria Stagers.  Had x-rays with revealed a small spur but not in the area of pain. Pain began spring of 2023 due to a lot of sanding, sawing, using a screw driver with his woodwork. Pt is ambidextrous, but writes and eats with R hand.   PRECAUTIONS: No known precautions   SUBJECTIVE:   SUBJECTIVE STATEMENT: R elbow strap helps. No R elbow pain currently. Comes and goes depending to what he does. Had a tooth pulled last Wednesday.   PAIN:  Are you  having pain? See subjective   TODAY'S TREATMENT:                                                                                                                                         DATE: 03/31/2022   Manual therapy  Seated STM R extensor tendons using EDGE tool to promote blood flow and decrease fascial restrictions    Therapeutic exercise   (-) R UE neural tension test R radial nerve  Finger extension spreads yellow band 10x2  Seated eccentric wrist extension blue band 10x2  Seated resisted supination/pronation with hammer, holding at distal end 10x3 each direction   Standing R shoulder extension with scapular retraction red band 10x2 with 5 second holds    Improved exercise technique, movement at target joints, use of target muscles after min verbal, visual, tactile cues.      Response to treatment Pt tolerated session well without aggravation of symptoms.    Clinical impression Demonstrates (-) radial nerve neural tension test. Continued utilizing edge tool with soft tissue mobilization to promote blood flow and decrease fascial restrictions, followed by eccentric loading to wrist extensor muscles to promote strength and healing.  Pt tolerated session well without aggravation of symptoms.  Pt will benefit from continued skilled physical therapy services to decrease pain, improve strength and function.          PATIENT EDUCATION: Education details: there-ex Person educated: Patient Education method: Explanation, Demonstration, Corporate treasurer cues, Verbal cues, and Handouts Education comprehension: verbalized understanding, returned demonstration, verbal cues required, and tactile cues required  HOME EXERCISE PROGRAM: Access Code: [email or web address] URL: [email or web address] Date: 03/03/2022 Prepared by: Joneen Boers  Exercises - Isometric Wrist Extension Pronated  - 1 x daily - 7 x weekly - 3 sets - 10 reps - 5 seconds hold - Seated Eccentric Wrist Extension   - 3 x daily - 7 x weekly - 1 sets - 10 reps   5 lbs (can use blue band provided as well)      PT Short Term Goals - 02/24/22 1743       PT SHORT TERM GOAL #1   Title Pt will be independent with his initial HEP to improve strength, function, and decrease pain.    Time 3    Period Weeks    Status New    Target Date 03/19/22  PT Long Term Goals - 02/24/22 1743       PT LONG TERM GOAL #1   Title Pt will have a decrease in R lateral elbow pain to 4/10 or less at worst to promote ability to perform woodworking as well as use his R hand and wrist as well as perform chores such as mopping more comfortably.    Baseline 9/10 at worst for the past 3 months (02/24/2022)    Time 6    Period Weeks    Status New    Target Date 04/09/22      PT LONG TERM GOAL #2   Title Pt will improve his FOTO score by at least 10 points as a demonstration of improved function.    Baseline R elbow FOTO 61 (02/24/2022)    Time 6    Period Weeks    Status New    Target Date 04/09/22              Plan - 03/31/22 1419     Clinical Impression Statement Demonstrates (-) radial nerve neural tension test. Continued utilizing edge tool with soft tissue mobilization to promote blood flow and decrease fascial restrictions, followed by eccentric loading to wrist extensor muscles to promote strength and healing.  Pt tolerated session well without aggravation of symptoms.  Pt will benefit from continued skilled physical therapy services to decrease pain, improve strength and function.    Personal Factors and Comorbidities Age;Comorbidity 3+;Time since onset of injury/illness/exacerbation    Comorbidities Prostate CA, kidney disease, DM, HTN    Examination-Activity Limitations Sleep;Other   moppoing, woodworking   Stability/Clinical Decision Making Stable/Uncomplicated    Clinical Decision Making Low    Rehab Potential Fair    PT Frequency 2x / week    PT Duration 6 weeks    PT  Treatment/Interventions Manual techniques;Neuromuscular re-education;Therapeutic exercise;Therapeutic activities;Electrical Stimulation;Iontophoresis '4mg'$ /ml Dexamethasone;Patient/family education;Dry needling    PT Next Visit Plan isometric, concentric, eccentric contraction, manual techniques, modalities PRN    PT Home Exercise Plan Medbridge Access Code: [email or web address]    Consulted and Agree with Plan of Care Patient                Joneen Boers PT, DPT  03/31/2022, 3:10 PM

## 2022-04-02 ENCOUNTER — Ambulatory Visit: Payer: Medicare Other

## 2022-04-02 DIAGNOSIS — M25521 Pain in right elbow: Secondary | ICD-10-CM | POA: Diagnosis not present

## 2022-04-02 DIAGNOSIS — M7711 Lateral epicondylitis, right elbow: Secondary | ICD-10-CM

## 2022-04-02 NOTE — Therapy (Signed)
OUTPATIENT PHYSICAL THERAPY TREATMENT NOTE   Patient Name: Scott Skinner MRN: [id number] DOB:[birth year], 72 y.o., male Today's Date: 04/02/2022  PCP: [name], MD  REFERRING PROVIDER: Diamond Nickel, DO   END OF SESSION:  PT End of Session - 04/02/22 1332     Visit Number 7    Number of Visits 13    Date for PT Re-Evaluation 04/09/22    PT Start Time 1332    PT Stop Time 0998    PT Time Calculation (min) 40 min    Activity Tolerance Patient tolerated treatment well    Behavior During Therapy WFL for tasks assessed/performed                  Past Medical History:  Diagnosis Date   Cancer ([place])    prostate cancer    Chronic kidney disease    Complication of anesthesia    anaphylaxis    Diabetes mellitus without complication (HCC)    GERD (gastroesophageal reflux disease)    Gout    History of kidney stones    Hyperlipidemia    Hypertension    Microvascular ischemia of myocardium    Obesity    OSA (obstructive sleep apnea)    Prostate cancer [facility])    Past Surgical History:  Procedure Laterality Date   COLONOSCOPY WITH PROPOFOL N/A 10/05/2016   Procedure: COLONOSCOPY WITH PROPOFOL;  Surgeon: [name], MD;  Location: [facility] ENDOSCOPY;  Service: Endoscopy;  Laterality: N/A;   COLONOSCOPY WITH PROPOFOL N/A 10/04/2019   Procedure: COLONOSCOPY WITH PROPOFOL;  Surgeon: Toledo, [name], MD;  Location: [facility] ENDOSCOPY;  Service: Gastroenterology;  Laterality: N/A;   CYST REMOVAL TRUNK  1977   ESOPHAGOGASTRODUODENOSCOPY (EGD) WITH PROPOFOL N/A 10/04/2019   Procedure: ESOPHAGOGASTRODUODENOSCOPY (EGD) WITH PROPOFOL;  Surgeon: Toledo, [name], MD;  Location: [facility] ENDOSCOPY;  Service: Gastroenterology;  Laterality: N/A;   LEFT HEART CATH AND CORONARY ANGIOGRAPHY Left 05/17/2019   Procedure: LEFT HEART CATH AND CORONARY ANGIOGRAPHY;  Surgeon: Corey Skains, MD;  Location: [place] CV LAB;  Service: Cardiovascular;  Laterality: Left;   NECK  SURGERY  2005   PROSTATE SURGERY     Patient Active Problem List   Diagnosis Date Noted   Personal history of gout 11/13/2020   History of prostate cancer 10/13/2019   CKD (chronic kidney disease) stage 3, GFR 30-59 ml/min (HCC) 06/05/2019   Microvascular ischemia of myocardium 06/01/2019   Angina pectoris ([place]) 05/10/2019   Encounter for general adult medical examination without abnormal findings 11/28/2018   Other obesity due to excess calories 07/26/2018   OSA on CPAP 11/30/2017   History of OCD (obsessive compulsive disorder) 06/01/2016   Mixed hypercholesterolemia and hypertriglyceridemia 04/20/2016   GERD without esophagitis 01/13/2016   Attention deficit disorder without hyperactivity 11/12/2015   Hypertension 05/14/2015   Diabetes mellitus type 2, uncontrolled, without complications 33/82/5053   Hypercalcemia 05/14/2015   Abdominal pain, LLQ (left lower quadrant) 04/23/2015   ED (erectile dysfunction) of organic origin 10/21/2012   Incomplete bladder emptying 10/21/2012   Malignant neoplasm of prostate ([place]) 10/21/2012   Testicular hypofunction 10/21/2012   Bladder outlet obstruction 10/21/2012    REFERRING DIAG: R elbow pain, enthesopathy of R elbow, muscle spasm   THERAPY DIAG:  Pain in right elbow  Lateral epicondylitis of right elbow  Rationale for Evaluation and Treatment Rehabilitation  PERTINENT HISTORY: R elbow tendinitis. Pain also travels up to his R shoulder (R C5/C6 dermatome). Pain calmed down since seeing Dr.  Kubinski. Had x-rays with revealed a small spur but not in the area of pain. Pain began spring of 2023 due to a lot of sanding, sawing, using a screw driver with his woodwork. Pt is ambidextrous, but writes and eats with R hand.   PRECAUTIONS: No known precautions   SUBJECTIVE:   SUBJECTIVE STATEMENT: R elbow is feeling pretty good. There was some pain this morning, did some exercises which helped loosen it up.    PAIN:  Are you having pain?  No pain reported.    TODAY'S TREATMENT:                                                                                                                                         DATE: 04/02/2022   Manual therapy  Seated STM R extensor tendons using EDGE tool to promote blood flow and decrease fascial restrictions    Therapeutic exercise    Finger extension spreads yellow band 10x3  Seated resisted supination/pronation with hammer, holding at distal end 10x3 each direction  Wand twists to roll 3 lbs dumbbell up and down (green band attached to PVC pipe and dumbbell) 2x  Seated eccentric wrist extension 5lbs 10x2    OMEGA rows plate 25 for 02H8 seconds for 2 sets     Improved exercise technique, movement at target joints, use of target muscles after min verbal, visual, tactile cues.      Response to treatment No R elbow pain reported throughout session.     Clinical impression   Continued utilizing edge tool with soft tissue mobilization to promote blood flow and decrease fascial restrictions, followed by eccentric loading to wrist extensor muscles to promote strength and healing.  Pt tolerated session well without aggravation of symptoms.  Pt will benefit from continued skilled physical therapy services to decrease pain, improve strength and function.          PATIENT EDUCATION: Education details: there-ex Person educated: Patient Education method: Explanation, Demonstration, Corporate treasurer cues, Verbal cues, and Handouts Education comprehension: verbalized understanding, returned demonstration, verbal cues required, and tactile cues required  HOME EXERCISE PROGRAM: Access Code: [email or web address] URL: [email or web address] Date: 03/03/2022 Prepared by: Joneen Boers  Exercises - Isometric Wrist Extension Pronated  - 1 x daily - 7 x weekly - 3 sets - 10 reps - 5 seconds hold - Seated Eccentric Wrist Extension  - 3 x daily - 7 x weekly - 1 sets - 10 reps   5 lbs  (can use blue band provided as well)      PT Short Term Goals - 02/24/22 1743       PT SHORT TERM GOAL #1   Title Pt will be independent with his initial HEP to improve strength, function, and decrease pain.    Time 3    Period Weeks    Status New    Target Date 03/19/22  PT Long Term Goals - 02/24/22 1743       PT LONG TERM GOAL #1   Title Pt will have a decrease in R lateral elbow pain to 4/10 or less at worst to promote ability to perform woodworking as well as use his R hand and wrist as well as perform chores such as mopping more comfortably.    Baseline 9/10 at worst for the past 3 months (02/24/2022)    Time 6    Period Weeks    Status New    Target Date 04/09/22      PT LONG TERM GOAL #2   Title Pt will improve his FOTO score by at least 10 points as a demonstration of improved function.    Baseline R elbow FOTO 61 (02/24/2022)    Time 6    Period Weeks    Status New    Target Date 04/09/22              Plan - 04/02/22 1330     Clinical Impression Statement Continued utilizing edge tool with soft tissue mobilization to promote blood flow and decrease fascial restrictions, followed by eccentric loading to wrist extensor muscles to promote strength and healing.  Pt tolerated session well without aggravation of symptoms.  Pt will benefit from continued skilled physical therapy services to decrease pain, improve strength and function.    Personal Factors and Comorbidities Age;Comorbidity 3+;Time since onset of injury/illness/exacerbation    Comorbidities Prostate CA, kidney disease, DM, HTN    Examination-Activity Limitations Sleep;Other   moppoing, woodworking   Stability/Clinical Decision Making Stable/Uncomplicated    Clinical Decision Making Low    Rehab Potential Fair    PT Frequency 2x / week    PT Duration 6 weeks    PT Treatment/Interventions Manual techniques;Neuromuscular re-education;Therapeutic exercise;Therapeutic  activities;Electrical Stimulation;Iontophoresis '4mg'$ /ml Dexamethasone;Patient/family education;Dry needling    PT Next Visit Plan isometric, concentric, eccentric contraction, manual techniques, modalities PRN    PT Home Exercise Plan Medbridge Access Code: [email or web address]    Consulted and Agree with Plan of Care Patient                Joneen Boers PT, DPT  04/02/2022, 5:24 PM

## 2022-04-07 ENCOUNTER — Ambulatory Visit: Payer: Medicare Other

## 2022-04-07 DIAGNOSIS — M25521 Pain in right elbow: Secondary | ICD-10-CM

## 2022-04-07 DIAGNOSIS — M7711 Lateral epicondylitis, right elbow: Secondary | ICD-10-CM

## 2022-04-07 NOTE — Therapy (Signed)
OUTPATIENT PHYSICAL THERAPY TREATMENT NOTE   Patient Name: Scott Skinner MRN: [id number] DOB:1950-11-04, 72 y.o., male Today's Date: 04/07/2022  PCP: [name], MD  REFERRING PROVIDER: Diamond Nickel, DO   END OF SESSION:  PT End of Session - 04/07/22 1329     Visit Number 8    Number of Visits 13    Date for PT Re-Evaluation 04/09/22    PT Start Time 1331    PT Stop Time 9675    PT Time Calculation (min) 46 min    Activity Tolerance Patient tolerated treatment well    Behavior During Therapy WFL for tasks assessed/performed                   Past Medical History:  Diagnosis Date   Cancer ([place])    prostate cancer    Chronic kidney disease    Complication of anesthesia    anaphylaxis    Diabetes mellitus without complication (HCC)    GERD (gastroesophageal reflux disease)    Gout    History of kidney stones    Hyperlipidemia    Hypertension    Microvascular ischemia of myocardium    Obesity    OSA (obstructive sleep apnea)    Prostate cancer [facility])    Past Surgical History:  Procedure Laterality Date   COLONOSCOPY WITH PROPOFOL N/A 10/05/2016   Procedure: COLONOSCOPY WITH PROPOFOL;  Surgeon: [name], MD;  Location: [facility] ENDOSCOPY;  Service: Endoscopy;  Laterality: N/A;   COLONOSCOPY WITH PROPOFOL N/A 10/04/2019   Procedure: COLONOSCOPY WITH PROPOFOL;  Surgeon: Toledo, [name], MD;  Location: [facility] ENDOSCOPY;  Service: Gastroenterology;  Laterality: N/A;   CYST REMOVAL TRUNK  1977   ESOPHAGOGASTRODUODENOSCOPY (EGD) WITH PROPOFOL N/A 10/04/2019   Procedure: ESOPHAGOGASTRODUODENOSCOPY (EGD) WITH PROPOFOL;  Surgeon: Toledo, [name], MD;  Location: [facility] ENDOSCOPY;  Service: Gastroenterology;  Laterality: N/A;   LEFT HEART CATH AND CORONARY ANGIOGRAPHY Left 05/17/2019   Procedure: LEFT HEART CATH AND CORONARY ANGIOGRAPHY;  Surgeon: Corey Skains, MD;  Location: [place] CV LAB;  Service: Cardiovascular;  Laterality: Left;    NECK SURGERY  2005   PROSTATE SURGERY     Patient Active Problem List   Diagnosis Date Noted   Personal history of gout 11/13/2020   History of prostate cancer 10/13/2019   CKD (chronic kidney disease) stage 3, GFR 30-59 ml/min (HCC) 06/05/2019   Microvascular ischemia of myocardium 06/01/2019   Angina pectoris ([place]) 05/10/2019   Encounter for general adult medical examination without abnormal findings 11/28/2018   Other obesity due to excess calories 07/26/2018   OSA on CPAP 11/30/2017   History of OCD (obsessive compulsive disorder) 06/01/2016   Mixed hypercholesterolemia and hypertriglyceridemia 04/20/2016   GERD without esophagitis 01/13/2016   Attention deficit disorder without hyperactivity 11/12/2015   Hypertension 05/14/2015   Diabetes mellitus type 2, uncontrolled, without complications 91/63/8466   Hypercalcemia 05/14/2015   Abdominal pain, LLQ (left lower quadrant) 04/23/2015   ED (erectile dysfunction) of organic origin 10/21/2012   Incomplete bladder emptying 10/21/2012   Malignant neoplasm of prostate ([place]) 10/21/2012   Testicular hypofunction 10/21/2012   Bladder outlet obstruction 10/21/2012    REFERRING DIAG: R elbow pain, enthesopathy of R elbow, muscle spasm   THERAPY DIAG:  Pain in right elbow  Lateral epicondylitis of right elbow  Rationale for Evaluation and Treatment Rehabilitation  PERTINENT HISTORY: R elbow tendinitis. Pain also travels up to his R shoulder (R C5/C6 dermatome). Pain calmed down since seeing  Dr. Candelaria Stagers. Had x-rays with revealed a small spur but not in the area of pain. Pain began spring of 2023 due to a lot of sanding, sawing, using a screw driver with his woodwork. Pt is ambidextrous, but writes and eats with R hand.   PRECAUTIONS: No known precautions   SUBJECTIVE:   SUBJECTIVE STATEMENT: R elbow has been stiff for the past 4 mornings. No pain, just tight. Did some stretches which help. No neck pain currently.     PAIN:  Are  you having pain? No pain reported.    TODAY'S TREATMENT:                                                                                                                                         DATE: 04/07/2022   Manual therapy  Seated STM cervical paraspinal and upper trap muscles to decrease tension  Decreased R forearm tightness reported.   Seated caudal glide R first rib grade 3 to promote movement.   Decreased R forearm tightness reported.   Therapeutic exercise   Seated B scapular retraction 10x3 with 5 second holds  Seated chin tucks 9x5 seconds   Seated L cervical side bend stretch 10x5 seconds   First rib stretch with strap  With contralateral cervical side bend R 30 seconds x 3 Reviewed and given as part of his HEP. Pt demonstrated and verbalized understanding. Handout provided.    Then with cervical rotation 10x each direction   Improved exercise technique, movement at target joints, use of target muscles after min verbal, visual, tactile cues.      Response to treatment Decreased R lateral elbow and forearm tightness reported after session.     Clinical impression  Worked on neck today secondary to possibility of referred symptoms to R elbow and forearm. Decreased tightness in the R forearm and elbow after treatment to decrease R upper trap and R scalene muscle tension to C6/C7 area. Pt demonstrates cervical and R extensor tendon involvement with symptoms. Pt will benefit from continued skilled physical therapy services to decrease pain, improve strength and function.          PATIENT EDUCATION: Education details: there-ex Person educated: Patient Education method: Explanation, Demonstration, Corporate treasurer cues, Verbal cues, and Handouts Education comprehension: verbalized understanding, returned demonstration, verbal cues required, and tactile cues required  HOME EXERCISE PROGRAM: Access Code: [email or web address] URL: [email or web address] Date:  03/03/2022 Prepared by: Joneen Boers  Exercises - Isometric Wrist Extension Pronated  - 1 x daily - 7 x weekly - 3 sets - 10 reps - 5 seconds hold - Seated Eccentric Wrist Extension  - 3 x daily - 7 x weekly - 1 sets - 10 reps   5 lbs (can use blue band provided as well)  - First Rib Mobilization with Strap  - 3 x daily - 7 x weekly - 1 sets - 3 reps - 30 seconds  hold    PT Short Term Goals - 02/24/22 1743       PT SHORT TERM GOAL #1   Title Pt will be independent with his initial HEP to improve strength, function, and decrease pain.    Time 3    Period Weeks    Status New    Target Date 03/19/22              PT Long Term Goals - 02/24/22 1743       PT LONG TERM GOAL #1   Title Pt will have a decrease in R lateral elbow pain to 4/10 or less at worst to promote ability to perform woodworking as well as use his R hand and wrist as well as perform chores such as mopping more comfortably.    Baseline 9/10 at worst for the past 3 months (02/24/2022)    Time 6    Period Weeks    Status New    Target Date 04/09/22      PT LONG TERM GOAL #2   Title Pt will improve his FOTO score by at least 10 points as a demonstration of improved function.    Baseline R elbow FOTO 61 (02/24/2022)    Time 6    Period Weeks    Status New    Target Date 04/09/22              Plan - 04/07/22 1328     Clinical Impression Statement Worked on neck today secondary to possibility of referred symptoms to R elbow and forearm. Decreased tightness in the R forearm and elbow after treatment to decrease R upper trap and R scalene muscle tension to C6/C7 area. Pt demonstrates cervical and R extensor tendon involvement with symptoms. Pt will benefit from continued skilled physical therapy services to decrease pain, improve strength and function.    Personal Factors and Comorbidities Age;Comorbidity 3+;Time since onset of injury/illness/exacerbation    Comorbidities Prostate CA, kidney disease, DM, HTN     Examination-Activity Limitations Sleep;Other   moppoing, woodworking   Stability/Clinical Decision Making Stable/Uncomplicated    Clinical Decision Making Low    Rehab Potential Fair    PT Frequency 2x / week    PT Duration 6 weeks    PT Treatment/Interventions Manual techniques;Neuromuscular re-education;Therapeutic exercise;Therapeutic activities;Electrical Stimulation;Iontophoresis '4mg'$ /ml Dexamethasone;Patient/family education;Dry needling    PT Next Visit Plan isometric, concentric, eccentric contraction, manual techniques, modalities PRN    PT Home Exercise Plan Medbridge Access Code: [email or web address]    Consulted and Agree with Plan of Care Patient                Joneen Boers PT, DPT  04/07/2022, 2:29 PM

## 2022-04-09 ENCOUNTER — Ambulatory Visit: Payer: Medicare Other

## 2022-04-14 ENCOUNTER — Ambulatory Visit: Payer: Medicare Other

## 2022-04-14 DIAGNOSIS — M25521 Pain in right elbow: Secondary | ICD-10-CM

## 2022-04-14 DIAGNOSIS — M7711 Lateral epicondylitis, right elbow: Secondary | ICD-10-CM

## 2022-04-14 NOTE — Therapy (Signed)
OUTPATIENT PHYSICAL THERAPY TREATMENT NOTE   Patient Name: Scott Skinner MRN: [id number] DOB:31-May-1950, 72 y.o., male Today's Date: 04/14/2022  PCP: [name], MD  REFERRING PROVIDER: Diamond Nickel, DO   END OF SESSION:  PT End of Session - 04/14/22 1331     Visit Number 9    Number of Visits 21    Date for PT Re-Evaluation 05/14/22    PT Start Time 1331    PT Stop Time 2947    PT Time Calculation (min) 52 min    Activity Tolerance Patient tolerated treatment well    Behavior During Therapy WFL for tasks assessed/performed                    Past Medical History:  Diagnosis Date   Cancer ([place])    prostate cancer    Chronic kidney disease    Complication of anesthesia    anaphylaxis    Diabetes mellitus without complication (HCC)    GERD (gastroesophageal reflux disease)    Gout    History of kidney stones    Hyperlipidemia    Hypertension    Microvascular ischemia of myocardium    Obesity    OSA (obstructive sleep apnea)    Prostate cancer [facility])    Past Surgical History:  Procedure Laterality Date   COLONOSCOPY WITH PROPOFOL N/A 10/05/2016   Procedure: COLONOSCOPY WITH PROPOFOL;  Surgeon: [name], MD;  Location: [facility] ENDOSCOPY;  Service: Endoscopy;  Laterality: N/A;   COLONOSCOPY WITH PROPOFOL N/A 10/04/2019   Procedure: COLONOSCOPY WITH PROPOFOL;  Surgeon: Toledo, [name], MD;  Location: [facility] ENDOSCOPY;  Service: Gastroenterology;  Laterality: N/A;   CYST REMOVAL TRUNK  1977   ESOPHAGOGASTRODUODENOSCOPY (EGD) WITH PROPOFOL N/A 10/04/2019   Procedure: ESOPHAGOGASTRODUODENOSCOPY (EGD) WITH PROPOFOL;  Surgeon: Toledo, [name], MD;  Location: [facility] ENDOSCOPY;  Service: Gastroenterology;  Laterality: N/A;   LEFT HEART CATH AND CORONARY ANGIOGRAPHY Left 05/17/2019   Procedure: LEFT HEART CATH AND CORONARY ANGIOGRAPHY;  Surgeon: Corey Skains, MD;  Location: [place] CV LAB;  Service: Cardiovascular;  Laterality: Left;    NECK SURGERY  2005   PROSTATE SURGERY     Patient Active Problem List   Diagnosis Date Noted   Personal history of gout 11/13/2020   History of prostate cancer 10/13/2019   CKD (chronic kidney disease) stage 3, GFR 30-59 ml/min (HCC) 06/05/2019   Microvascular ischemia of myocardium 06/01/2019   Angina pectoris ([place]) 05/10/2019   Encounter for general adult medical examination without abnormal findings 11/28/2018   Other obesity due to excess calories 07/26/2018   OSA on CPAP 11/30/2017   History of OCD (obsessive compulsive disorder) 06/01/2016   Mixed hypercholesterolemia and hypertriglyceridemia 04/20/2016   GERD without esophagitis 01/13/2016   Attention deficit disorder without hyperactivity 11/12/2015   Hypertension 05/14/2015   Diabetes mellitus type 2, uncontrolled, without complications 65/46/5035   Hypercalcemia 05/14/2015   Abdominal pain, LLQ (left lower quadrant) 04/23/2015   ED (erectile dysfunction) of organic origin 10/21/2012   Incomplete bladder emptying 10/21/2012   Malignant neoplasm of prostate ([place]) 10/21/2012   Testicular hypofunction 10/21/2012   Bladder outlet obstruction 10/21/2012    REFERRING DIAG: R elbow pain, enthesopathy of R elbow, muscle spasm   THERAPY DIAG:  Pain in right elbow - Plan: PT plan of care cert/re-cert  Lateral epicondylitis of right elbow - Plan: PT plan of care cert/re-cert  Rationale for Evaluation and Treatment Rehabilitation  PERTINENT HISTORY: R elbow tendinitis. Pain  also travels up to his R shoulder (R C5/C6 dermatome). Pain calmed down since seeing Dr. Candelaria Stagers. Had x-rays with revealed a small spur but not in the area of pain. Pain began spring of 2023 due to a lot of sanding, sawing, using a screw driver with his woodwork. Pt is ambidextrous, but writes and eats with R hand.   PRECAUTIONS: No known precautions   SUBJECTIVE:   SUBJECTIVE STATEMENT: R forearm is hurting. Did some mopping and light woodwork. Has been  doing the neck exercises which helps loosen stuff. 6/10 Sunday morning (did mopping the other day). Was really tight this morning       PAIN:  Are you having pain? No pain reported.    TODAY'S TREATMENT:                                                                                                                                         DATE: 04/14/2022   Manual therapy   Seated STM cervical paraspinal and upper trap muscles to decrease tension   Seated caudal glide R first rib grade 3 to promote movement.    Decreased R forearm tightness reported.      Supine STM upper cervical paraspinal muscles R > L to decrease tension       Therapeutic exercise    First rib stretch with PT  With contralateral cervical side bend R 30 seconds x 3   Then with cervical rotation 10x3 each direction   Decreased R forearm symptoms.   Hooklying chin tucks with B scapular retraction 10x5 seconds for 2 sets  Hooklying B shoulder horizontal abduction ("open books")  10x5 seconds to promote thoracic extension and decrease stress to neck   B lateral forearm symptoms (median nerve)  Hooklying cervical nod 10x5 seconds   Hooklying cervical rotation 10x2 each direction       Improved exercise technique, movement at target joints, use of target muscles after min verbal, visual, tactile cues.      Response to treatment Decreased R lateral elbow and forearm tightness reported after session.     Clinical impression  Pt demonstrates cervical involvement with R lateral forearm pain secondary to decrease symptoms with decreasing muscle tension and extension stress to cervical spine. Pt demonstrates limited R cervical rotation AROM as well with R cervical side bending compensation. Focused on treating his neck today secondary to aforementioned reasons. Pt also demonstrates overall decreased R elbow pain since initial evaluation. Pt will benefit from continued skilled physical therapy  services to decrease pain, improve strength and function.          PATIENT EDUCATION: Education details: there-ex Person educated: Patient Education method: Explanation, Demonstration, Corporate treasurer cues, Verbal cues, and Handouts Education comprehension: verbalized understanding, returned demonstration, verbal cues required, and tactile cues required  HOME EXERCISE PROGRAM: Access Code: [email or web address] URL: [email or web address] Date: 03/03/2022 Prepared by: Joneen Boers  Exercises -  Isometric Wrist Extension Pronated  - 1 x daily - 7 x weekly - 3 sets - 10 reps - 5 seconds hold - Seated Eccentric Wrist Extension  - 3 x daily - 7 x weekly - 1 sets - 10 reps   5 lbs (can use blue band provided as well)  - First Rib Mobilization with Strap  - 3 x daily - 7 x weekly - 1 sets - 3 reps - 30 seconds hold     PT Short Term Goals - 04/14/22 1351       PT SHORT TERM GOAL #1   Title Pt will be independent with his initial HEP to improve strength, function, and decrease pain.    Baseline Pt performing HEP, no questions (04/14/2022)    Time 3    Period Weeks    Status Achieved    Target Date 03/19/22              PT Long Term Goals - 04/14/22 1352       PT LONG TERM GOAL #1   Title Pt will have a decrease in R lateral elbow pain to 4/10 or less at worst to promote ability to perform woodworking as well as use his R hand and wrist as well as perform chores such as mopping more comfortably.    Baseline 9/10 at worst for the past 3 months (02/24/2022); 5/10 at most for the past 7 days off and on (04/14/2022)    Time 4    Period Weeks    Status Partially Met    Target Date 05/14/22      PT LONG TERM GOAL #2   Title Pt will improve his FOTO score by at least 10 points as a demonstration of improved function.    Baseline R elbow FOTO 61 (02/24/2022); 60 (04/14/2022)    Time 4    Period Weeks    Status On-going    Target Date 05/14/22              Plan - 04/14/22  1331     Clinical Impression Statement Pt demonstrates cervical involvement with R lateral forearm pain secondary to decrease symptoms with decreasing muscle tension and extension stress to cervical spine. Pt demonstrates limited R cervical rotation AROM as well with R cervical side bending compensation. Focused on treating his neck today secondary to aforementioned reasons. Pt also demonstrates overall decreased R elbow pain since initial evaluation. Pt will benefit from continued skilled physical therapy services to decrease pain, improve strength and function.    Personal Factors and Comorbidities Age;Comorbidity 3+;Time since onset of injury/illness/exacerbation    Comorbidities Prostate CA, kidney disease, DM, HTN    Examination-Activity Limitations Sleep;Other   moppoing, woodworking   Stability/Clinical Decision Making Stable/Uncomplicated    Clinical Decision Making Low    Rehab Potential Fair    PT Frequency 2x / week    PT Duration 4 weeks    PT Treatment/Interventions Manual techniques;Neuromuscular re-education;Therapeutic exercise;Therapeutic activities;Electrical Stimulation;Iontophoresis '4mg'$ /ml Dexamethasone;Patient/family education;Dry needling    PT Next Visit Plan isometric, concentric, eccentric contraction, manual techniques, modalities PRN    PT Home Exercise Plan Medbridge Access Code: [email or web address]    Consulted and Agree with Plan of Care Patient                 Joneen Boers PT, DPT  04/14/2022, 2:37 PM

## 2022-04-16 ENCOUNTER — Ambulatory Visit: Payer: Medicare Other | Attending: Sports Medicine

## 2022-04-16 DIAGNOSIS — M7711 Lateral epicondylitis, right elbow: Secondary | ICD-10-CM | POA: Diagnosis present

## 2022-04-16 DIAGNOSIS — M25521 Pain in right elbow: Secondary | ICD-10-CM | POA: Insufficient documentation

## 2022-04-16 NOTE — Therapy (Signed)
OUTPATIENT PHYSICAL THERAPY TREATMENT NOTE And Progress Report (02/24/2022 - 04/16/2022)   Patient Name: Scott Skinner MRN: [id number] DOB:Jun 08, 1950, 72 y.o., male Today's Date: 04/16/2022  PCP: [name], MD  REFERRING PROVIDER: Diamond Nickel, DO   END OF SESSION:  PT End of Session - 04/16/22 1148     Visit Number 10    Number of Visits 21    Date for PT Re-Evaluation 05/14/22    PT Start Time 1151    PT Stop Time 1230    PT Time Calculation (min) 39 min    Activity Tolerance Patient tolerated treatment well    Behavior During Therapy WFL for tasks assessed/performed                    Past Medical History:  Diagnosis Date   Cancer ([place])    prostate cancer    Chronic kidney disease    Complication of anesthesia    anaphylaxis    Diabetes mellitus without complication (HCC)    GERD (gastroesophageal reflux disease)    Gout    History of kidney stones    Hyperlipidemia    Hypertension    Microvascular ischemia of myocardium    Obesity    OSA (obstructive sleep apnea)    Prostate cancer ([place])    Past Surgical History:  Procedure Laterality Date   COLONOSCOPY WITH PROPOFOL N/A 10/05/2016   Procedure: COLONOSCOPY WITH PROPOFOL;  Surgeon: [name], MD;  Location: [facility] ENDOSCOPY;  Service: Endoscopy;  Laterality: N/A;   COLONOSCOPY WITH PROPOFOL N/A 10/04/2019   Procedure: COLONOSCOPY WITH PROPOFOL;  Surgeon: Toledo, [name], MD;  Location: [facility] ENDOSCOPY;  Service: Gastroenterology;  Laterality: N/A;   CYST REMOVAL TRUNK  1977   ESOPHAGOGASTRODUODENOSCOPY (EGD) WITH PROPOFOL N/A 10/04/2019   Procedure: ESOPHAGOGASTRODUODENOSCOPY (EGD) WITH PROPOFOL;  Surgeon: Toledo, [name], MD;  Location: [facility] ENDOSCOPY;  Service: Gastroenterology;  Laterality: N/A;   LEFT HEART CATH AND CORONARY ANGIOGRAPHY Left 05/17/2019   Procedure: LEFT HEART CATH AND CORONARY ANGIOGRAPHY;  Surgeon: Corey Skains, MD;  Location: [facility];   Service: Cardiovascular;  Laterality: Left;   NECK SURGERY  2005   PROSTATE SURGERY     Patient Active Problem List   Diagnosis Date Noted   Personal history of gout 11/13/2020   History of prostate cancer 10/13/2019   CKD (chronic kidney disease) stage 3, GFR 30-59 ml/min (HCC) 06/05/2019   Microvascular ischemia of myocardium 06/01/2019   Angina pectoris ([place]) 05/10/2019   Encounter for general adult medical examination without abnormal findings 11/28/2018   Other obesity due to excess calories 07/26/2018   OSA on CPAP 11/30/2017   History of OCD (obsessive compulsive disorder) 06/01/2016   Mixed hypercholesterolemia and hypertriglyceridemia 04/20/2016   GERD without esophagitis 01/13/2016   Attention deficit disorder without hyperactivity 11/12/2015   Hypertension 05/14/2015   Diabetes mellitus type 2, uncontrolled, without complications 73/53/2992   Hypercalcemia 05/14/2015   Abdominal pain, LLQ (left lower quadrant) 04/23/2015   ED (erectile dysfunction) of organic origin 10/21/2012   Incomplete bladder emptying 10/21/2012   Malignant neoplasm of prostate ([place]) 10/21/2012   Testicular hypofunction 10/21/2012   Bladder outlet obstruction 10/21/2012    REFERRING DIAG: R elbow pain, enthesopathy of R elbow, muscle spasm   THERAPY DIAG:  Pain in right elbow  Lateral epicondylitis of right elbow  Rationale for Evaluation and Treatment Rehabilitation  PERTINENT HISTORY: R elbow tendinitis. Pain also travels up to his R shoulder (R  C5/C6 dermatome). Pain calmed down since seeing Dr. Candelaria Stagers. Had x-rays with revealed a small spur but not in the area of pain. Pain began spring of 2023 due to a lot of sanding, sawing, using a screw driver with his woodwork. Pt is ambidextrous, but writes and eats with R hand.   PRECAUTIONS: No known precautions   SUBJECTIVE:   SUBJECTIVE STATEMENT: R forearm had its moments yesterday. No pain. Feels discomfort R proximal medial anterior  forearm area. R upper trap tightness currently.      PAIN:  Are you having pain? No pain reported.    TODAY'S TREATMENT:                                                                                                                                         DATE: 04/16/2022   Manual therapy  Seated STM cervical paraspinal and upper trap muscles to decrease tension   Seated caudal glide R first rib grade 3 to promote movement.    Decreased R forearm tightness reported.   Seated STM R lateral and posterior lateral neck to decrease fascial restrictions   Decreased R forearm tenderness afterwards      Therapeutic exercise   Seated manually resisted R wrist extension isometrics in end range wrist extension 10x3 with 5 second holds  Seated chin tucks 10x5 seconds for 2 sets  Reviewed and given as part of his HEP. PT demonstrated and verbalized undrstanding, handout provided.,   First rib stretch with strap  With contralateral cervical side bend R 30 seconds x 3   Then with cervical rotation 10x3 each direction     Improved exercise technique, movement at target joints, use of target muscles after min verbal, visual, tactile cues.      Response to treatment Decreased R lateral elbow and forearm tightness reported after treatment to decrease muscle tension and extension stress to lower cervical spine.     Clinical impression Decreased R lateral elbow and forearm tightness reported after treatment to decrease muscle tension and extension stress to lower cervical spine suggesting cervical involvement with R lateral forearm pain. Pt demonstrates limited R cervical rotation AROM as well with R cervical side bending compensation. Focused on treating his neck secondary to aforementioned reasons. Pt also demonstrates overall decreased R elbow pain since initial evaluation. Pt will benefit from continued skilled physical therapy services to decrease pain, improve strength and  function.          PATIENT EDUCATION: Education details: there-ex Person educated: Patient Education method: Explanation, Demonstration, Corporate treasurer cues, Verbal cues, and Handouts Education comprehension: verbalized understanding, returned demonstration, verbal cues required, and tactile cues required  HOME EXERCISE PROGRAM: Access Code: [email or web address] URL: [email or web address] Date: 03/03/2022 Prepared by: Joneen Boers  Exercises - Isometric Wrist Extension Pronated  - 1 x daily - 7 x weekly - 3 sets - 10 reps - 5 seconds hold - Seated  Eccentric Wrist Extension  - 3 x daily - 7 x weekly - 1 sets - 10 reps   5 lbs (can use blue band provided as well)  - First Rib Mobilization with Strap  - 3 x daily - 7 x weekly - 1 sets - 3 reps - 30 seconds hold - Seated Cervical Retraction  - 1 x daily - 7 x weekly - 2 sets - 10 reps - 5 seconds  hold    PT Short Term Goals - 04/14/22 1351       PT SHORT TERM GOAL #1   Title Pt will be independent with his initial HEP to improve strength, function, and decrease pain.    Baseline Pt performing HEP, no questions (04/14/2022)    Time 3    Period Weeks    Status Achieved    Target Date 03/19/22              PT Long Term Goals - 04/14/22 1352       PT LONG TERM GOAL #1   Title Pt will have a decrease in R lateral elbow pain to 4/10 or less at worst to promote ability to perform woodworking as well as use his R hand and wrist as well as perform chores such as mopping more comfortably.    Baseline 9/10 at worst for the past 3 months (02/24/2022); 5/10 at most for the past 7 days off and on (04/14/2022)    Time 4    Period Weeks    Status Partially Met    Target Date 05/14/22      PT LONG TERM GOAL #2   Title Pt will improve his FOTO score by at least 10 points as a demonstration of improved function.    Baseline R elbow FOTO 61 (02/24/2022); 60 (04/14/2022)    Time 4    Period Weeks    Status On-going    Target Date  05/14/22              Plan - 04/16/22 1310     Clinical Impression Statement Decreased R lateral elbow and forearm tightness reported after treatment to decrease muscle tension and extension stress to lower cervical spine suggesting cervical involvement with R lateral forearm pain. Pt demonstrates limited R cervical rotation AROM as well with R cervical side bending compensation. Focused on treating his neck secondary to aforementioned reasons. Pt also demonstrates overall decreased R elbow pain since initial evaluation. Pt will benefit from continued skilled physical therapy services to decrease pain, improve strength and function.    Personal Factors and Comorbidities Age;Comorbidity 3+;Time since onset of injury/illness/exacerbation    Comorbidities Prostate CA, kidney disease, DM, HTN    Examination-Activity Limitations Sleep;Other   moppoing, woodworking   Stability/Clinical Decision Making Stable/Uncomplicated    Clinical Decision Making Low    Rehab Potential Fair    PT Frequency 2x / week    PT Duration 4 weeks    PT Treatment/Interventions Manual techniques;Neuromuscular re-education;Therapeutic exercise;Therapeutic activities;Electrical Stimulation;Iontophoresis '4mg'$ /ml Dexamethasone;Patient/family education;Dry needling    PT Next Visit Plan isometric, concentric, eccentric contraction, manual techniques, modalities PRN    PT Home Exercise Plan Medbridge Access Code: [email or web address]    Consulted and Agree with Plan of Care Patient               Thank you for your referral.    Joneen Boers PT, DPT  04/16/2022, 1:18 PM

## 2022-04-22 ENCOUNTER — Ambulatory Visit: Payer: Medicare Other

## 2022-04-22 DIAGNOSIS — M25521 Pain in right elbow: Secondary | ICD-10-CM

## 2022-04-22 DIAGNOSIS — M7711 Lateral epicondylitis, right elbow: Secondary | ICD-10-CM

## 2022-04-22 NOTE — Therapy (Signed)
OUTPATIENT PHYSICAL THERAPY TREATMENT NOTE    Patient Name: Scott Skinner MRN: [id number] DOB:07-18-1950, 72 y.o., male Today's Date: 04/22/2022  PCP: [name], MD  REFERRING PROVIDER: Diamond Nickel, DO   END OF SESSION:  PT End of Session - 04/22/22 1602     Visit Number 11    Number of Visits 21    Date for PT Re-Evaluation 05/14/22    PT Start Time 9150    PT Stop Time 1630    PT Time Calculation (min) 40 min    Activity Tolerance Patient tolerated treatment well    Behavior During Therapy [facility] for tasks assessed/performed                    Past Medical History:  Diagnosis Date   Cancer ([place])    prostate cancer    Chronic kidney disease    Complication of anesthesia    anaphylaxis    Diabetes mellitus without complication (HCC)    GERD (gastroesophageal reflux disease)    Gout    History of kidney stones    Hyperlipidemia    Hypertension    Microvascular ischemia of myocardium    Obesity    OSA (obstructive sleep apnea)    Prostate cancer ([place])    Past Surgical History:  Procedure Laterality Date   COLONOSCOPY WITH PROPOFOL N/A 10/05/2016   Procedure: COLONOSCOPY WITH PROPOFOL;  Surgeon: [name], MD;  Location: [facility] ENDOSCOPY;  Service: Endoscopy;  Laterality: N/A;   COLONOSCOPY WITH PROPOFOL N/A 10/04/2019   Procedure: COLONOSCOPY WITH PROPOFOL;  Surgeon: Toledo, [name], MD;  Location: [facility] ENDOSCOPY;  Service: Gastroenterology;  Laterality: N/A;   CYST REMOVAL TRUNK  1977   ESOPHAGOGASTRODUODENOSCOPY (EGD) WITH PROPOFOL N/A 10/04/2019   Procedure: ESOPHAGOGASTRODUODENOSCOPY (EGD) WITH PROPOFOL;  Surgeon: Toledo, [name], MD;  Location: [facility] ENDOSCOPY;  Service: Gastroenterology;  Laterality: N/A;   LEFT HEART CATH AND CORONARY ANGIOGRAPHY Left 05/17/2019   Procedure: LEFT HEART CATH AND CORONARY ANGIOGRAPHY;  Surgeon: Corey Skains, MD;  Location: [place] [facility] LAB;  Service: Cardiovascular;  Laterality: Left;    NECK SURGERY  2005   PROSTATE SURGERY     Patient Active Problem List   Diagnosis Date Noted   Personal history of gout 11/13/2020   History of prostate cancer 10/13/2019   CKD (chronic kidney disease) stage 3, GFR 30-59 ml/min (HCC) 06/05/2019   Microvascular ischemia of myocardium 06/01/2019   Angina pectoris ([place]) 05/10/2019   Encounter for general adult medical examination without abnormal findings 11/28/2018   Other obesity due to excess calories 07/26/2018   OSA on CPAP 11/30/2017   History of OCD (obsessive compulsive disorder) 06/01/2016   Mixed hypercholesterolemia and hypertriglyceridemia 04/20/2016   GERD without esophagitis 01/13/2016   Attention deficit disorder without hyperactivity 11/12/2015   Hypertension 05/14/2015   Diabetes mellitus type 2, uncontrolled, without complications 56/97/9480   Hypercalcemia 05/14/2015   Abdominal pain, LLQ (left lower quadrant) 04/23/2015   ED (erectile dysfunction) of organic origin 10/21/2012   Incomplete bladder emptying 10/21/2012   Malignant neoplasm of prostate ([place]) 10/21/2012   Testicular hypofunction 10/21/2012   Bladder outlet obstruction 10/21/2012    REFERRING DIAG: R elbow pain, enthesopathy of R elbow, muscle spasm   THERAPY DIAG:  Pain in right elbow  Lateral epicondylitis of right elbow  Rationale for Evaluation and Treatment Rehabilitation  PERTINENT HISTORY: R elbow tendinitis. Pain also travels up to his R shoulder (R C5/C6 dermatome). Pain calmed down  since seeing Dr. Candelaria Stagers. Had x-rays with revealed a small spur but not in the area of pain. Pain began spring of 2023 due to a lot of sanding, sawing, using a screw driver with his woodwork. Pt is ambidextrous, but writes and eats with R hand.   PRECAUTIONS: No known precautions   SUBJECTIVE:   SUBJECTIVE STATEMENT: R forearm currently a 4/10 NPS. Elbow brace has been helpful.      PAIN:  Are you having pain? No pain reported.    TODAY'S  TREATMENT:                                                                                                                                         DATE: 04/22/2022    There.ex:   Seated R first rib grade 4 inferior mobilizations to improve Joint mobility and reduce nerve impingement: 3x30 sec bouts. Endorses R elbow extensor group relief.    Seated R upper trap stretch: 3x30 sec with PT OP. Added L rotation to progress stretch.      Hook lying chin tucks: 2x20 reps.     Hook lying cervical L rotation: x20/side    Seated with R forearm supported on mat table with wrist unsupported for wrist/elbow strengthening:    Wrist extension 5#: 3x12   Wrist supination and pronation hammer grip on 3# DB   Improved exercise technique, movement at target joints, use of target muscles after min verbal, visual, tactile cues.      Response to treatment Decreased R elbow pain with cervical mobility.    Clinical impression Continuing PT POC addressing cervical restrictions and R elbow pain. Continued relief reported with cervical mobility and first rib mobilizations. Returning to eccentric and concentric wrist strengthening to address wrist extensor and pronator pain. Some mild exacerbation of pain with tendon rehab as expected. Pt remains motivated to improve symptoms. Reviewed modifying grips on power tools versus manual tools and expected symptom response. Pt verbalized understanding. Pt will continue to benefit from skilled PT services to address remaining deficits to return to pain free ADL and recreational activity completion.        PATIENT EDUCATION: Education details: there-ex Person educated: Patient Education method: Explanation, Demonstration, Corporate treasurer cues, Verbal cues, and Handouts Education comprehension: verbalized understanding, returned demonstration, verbal cues required, and tactile cues required  HOME EXERCISE PROGRAM: Access Code: [email or web address] URL:  [email or web address] Date: 03/03/2022 Prepared by: Joneen Boers  Exercises - Isometric Wrist Extension Pronated  - 1 x daily - 7 x weekly - 3 sets - 10 reps - 5 seconds hold - Seated Eccentric Wrist Extension  - 3 x daily - 7 x weekly - 1 sets - 10 reps   5 lbs (can use blue band provided as well)  - First Rib Mobilization with Strap  - 3 x daily - 7 x weekly - 1 sets - 3 reps - 30 seconds hold -  Seated Cervical Retraction  - 1 x daily - 7 x weekly - 2 sets - 10 reps - 5 seconds  hold    PT Short Term Goals - 04/14/22 1351       PT SHORT TERM GOAL #1   Title Pt will be independent with his initial HEP to improve strength, function, and decrease pain.    Baseline Pt performing HEP, no questions (04/14/2022)    Time 3    Period Weeks    Status Achieved    Target Date 03/19/22              PT Long Term Goals - 04/14/22 1352       PT LONG TERM GOAL #1   Title Pt will have a decrease in R lateral elbow pain to 4/10 or less at worst to promote ability to perform woodworking as well as use his R hand and wrist as well as perform chores such as mopping more comfortably.    Baseline 9/10 at worst for the past 3 months (02/24/2022); 5/10 at most for the past 7 days off and on (04/14/2022)    Time 4    Period Weeks    Status Partially Met    Target Date 05/14/22      PT LONG TERM GOAL #2   Title Pt will improve his FOTO score by at least 10 points as a demonstration of improved function.    Baseline R elbow FOTO 61 (02/24/2022); 60 (04/14/2022)    Time 4    Period Weeks    Status On-going    Target Date 05/14/22                Salem Caster. Fairly IV, PT, DPT Physical Therapist- [facility]  04/22/2022, 4:34 PM

## 2022-04-27 ENCOUNTER — Ambulatory Visit: Payer: Medicare Other

## 2022-04-27 DIAGNOSIS — M25521 Pain in right elbow: Secondary | ICD-10-CM | POA: Diagnosis not present

## 2022-04-27 DIAGNOSIS — M7711 Lateral epicondylitis, right elbow: Secondary | ICD-10-CM

## 2022-04-27 NOTE — Therapy (Signed)
OUTPATIENT PHYSICAL THERAPY TREATMENT NOTE    Patient Name: Scott Skinner MRN: [id number] DOB:07/29/1950, 72 y.o., male Today's Date: 04/27/2022  PCP: [name], MD  REFERRING PROVIDER: Diamond Nickel, DO   END OF SESSION:  PT End of Session - 04/27/22 0848     Visit Number 12    Number of Visits 21    Date for PT Re-Evaluation 05/14/22    PT Start Time 0848    PT Stop Time 0933    PT Time Calculation (min) 45 min    Activity Tolerance Patient tolerated treatment well    Behavior During Therapy [facility] for tasks assessed/performed                     Past Medical History:  Diagnosis Date   Cancer ([place])    prostate cancer    Chronic kidney disease    Complication of anesthesia    anaphylaxis    Diabetes mellitus without complication (HCC)    GERD (gastroesophageal reflux disease)    Gout    History of kidney stones    Hyperlipidemia    Hypertension    Microvascular ischemia of myocardium    Obesity    OSA (obstructive sleep apnea)    Prostate cancer ([place])    Past Surgical History:  Procedure Laterality Date   COLONOSCOPY WITH PROPOFOL N/A 10/05/2016   Procedure: COLONOSCOPY WITH PROPOFOL;  Surgeon: [name], MD;  Location[facility] ENDOSCOPY;  Service: Endoscopy;  Laterality: N/A;   COLONOSCOPY WITH PROPOFOL N/A 10/04/2019   Procedure: COLONOSCOPY WITH PROPOFOL;  Surgeon: Toledo, [name], MD;  Location: [facility] ENDOSCOPY;  Service: Gastroenterology;  Laterality: N/A;   CYST REMOVAL TRUNK  1977   ESOPHAGOGASTRODUODENOSCOPY (EGD) WITH PROPOFOL N/A 10/04/2019   Procedure: ESOPHAGOGASTRODUODENOSCOPY (EGD) WITH PROPOFOL;  Surgeon: Toledo, [name], MD;  Location: [facility] ENDOSCOPY;  Service: Gastroenterology;  Laterality: N/A;   LEFT HEART CATH AND CORONARY ANGIOGRAPHY Left 05/17/2019   Procedure: LEFT HEART CATH AND CORONARY ANGIOGRAPHY;  Surgeon: Corey Skains, MD;  Location: [place] CV LAB;  Service: Cardiovascular;  Laterality:  Left;   NECK SURGERY  2005   PROSTATE SURGERY     Patient Active Problem List   Diagnosis Date Noted   Personal history of gout 11/13/2020   History of prostate cancer 10/13/2019   CKD (chronic kidney disease) stage 3, GFR 30-59 ml/min (HCC) 06/05/2019   Microvascular ischemia of myocardium 06/01/2019   Angina pectoris ([place]) 05/10/2019   Encounter for general adult medical examination without abnormal findings 11/28/2018   Other obesity due to excess calories 07/26/2018   OSA on CPAP 11/30/2017   History of OCD (obsessive compulsive disorder) 06/01/2016   Mixed hypercholesterolemia and hypertriglyceridemia 04/20/2016   GERD without esophagitis 01/13/2016   Attention deficit disorder without hyperactivity 11/12/2015   Hypertension 05/14/2015   Diabetes mellitus type 2, uncontrolled, without complications [id number]   Hypercalcemia 05/14/2015   Abdominal pain, LLQ (left lower quadrant) 04/23/2015   ED (erectile dysfunction) of organic origin 10/21/2012   Incomplete bladder emptying 10/21/2012   Malignant neoplasm of prostate ([place]) 10/21/2012   Testicular hypofunction 10/21/2012   Bladder outlet obstruction 10/21/2012    REFERRING DIAG: R elbow pain, enthesopathy of R elbow, muscle spasm   THERAPY DIAG:  Pain in right elbow  Lateral epicondylitis of right elbow  Rationale for Evaluation and Treatment Rehabilitation  PERTINENT HISTORY: R elbow tendinitis. Pain also travels up to his R shoulder (R C5/C6 dermatome). Pain calmed  down since seeing Dr. Candelaria Stagers. Had x-rays with revealed a small spur but not in the area of pain. Pain began spring of 2023 due to a lot of sanding, sawing, using a screw driver with his woodwork. Pt is ambidextrous, but writes and eats with R hand.   PRECAUTIONS: No known precautions   SUBJECTIVE:   SUBJECTIVE STATEMENT: R elbow and forearm hurts. Did some wood work Saturday which stirred it up. Gripping and lifting the air nail gun and some sanding for  about 4- 5 hours total.     PAIN:  Are you having pain? 7/10 currently   TODAY'S TREATMENT:                                                                                                                                         DATE: 04/27/2022     Manual therapy  Seated caudal glide R first rib grade 3 to promote movement.    Then with caudal pressure to R first rib with R and L cervical rotation 10x3   Decreased R forearm tightness reported afterwards  Seated STM cervical paraspinal and upper trap muscles to decrease tension  Muscle knot R upper trap muscle palpated with reproduction of R arm and forearm symptoms.   Seated R P to A to radial head at the proximal radioulnar joint, grade 3.  Decreased symptoms R proximal forearm anteriorly.       Therapeutic exercise   Standing R shoulder extension with scapular retraction, palm forward, green band 10x2  Then palm down 10x2  Seated R triceps extension isometrics 10x3 with 5 second holds     Improved exercise technique, movement at target joints, use of target muscles after min verbal, visual, tactile cues.          Response to treatment Decreased R lateral elbow and forearm symptoms reported after treatment to decrease muscle tension at R scalene and upper trap.     Clinical impression  Demonstrates R radial head disomfort as well which decreased with R P to A. Demonstrates R upper trap trigger point with reproduction of R arm and forearm symptoms. Decreased R UE radiating symptoms after treatment to decrease muscle tension to R scalene and upper trap. Still demonstrates R proximal radioulnar joint discomfort with forearm supination and pronation. Pt tolerated session well without aggravation of symptoms. Pt will benefit from continued skilled physical therapy services to decrease pain, improve strength and function.          PATIENT EDUCATION: Education details: there-ex Person educated:  Patient Education method: Explanation, Demonstration, Corporate treasurer cues, Verbal cues, and Handouts Education comprehension: verbalized understanding, returned demonstration, verbal cues required, and tactile cues required  HOME EXERCISE PROGRAM: Access Code: [email or web address] URL: [email or web address] Date: 03/03/2022 Prepared by: Joneen Boers  Exercises - Isometric Wrist Extension Pronated  - 1 x daily - 7 x weekly - 3 sets - 10 reps -  5 seconds hold - Seated Eccentric Wrist Extension  - 3 x daily - 7 x weekly - 1 sets - 10 reps   5 lbs (can use blue band provided as well)  - First Rib Mobilization with Strap  - 3 x daily - 7 x weekly - 1 sets - 3 reps - 30 seconds hold - Seated Cervical Retraction  - 1 x daily - 7 x weekly - 2 sets - 10 reps - 5 seconds  hold    PT Short Term Goals - 04/14/22 1351       PT SHORT TERM GOAL #1   Title Pt will be independent with his initial HEP to improve strength, function, and decrease pain.    Baseline Pt performing HEP, no questions (04/14/2022)    Time 3    Period Weeks    Status Achieved    Target Date 03/19/22              PT Long Term Goals - 04/14/22 1352       PT LONG TERM GOAL #1   Title Pt will have a decrease in R lateral elbow pain to 4/10 or less at worst to promote ability to perform woodworking as well as use his R hand and wrist as well as perform chores such as mopping more comfortably.    Baseline 9/10 at worst for the past 3 months (02/24/2022); 5/10 at most for the past 7 days off and on (04/14/2022)    Time 4    Period Weeks    Status Partially Met    Target Date 05/14/22      PT LONG TERM GOAL #2   Title Pt will improve his FOTO score by at least 10 points as a demonstration of improved function.    Baseline R elbow FOTO 61 (02/24/2022); 60 (04/14/2022)    Time 4    Period Weeks    Status On-going    Target Date 05/14/22              Plan - 04/27/22 0846     Clinical Impression Statement  Demonstrates R radial head disomfort as well which decreased with R P to A. Demonstrates R upper trap trigger point with reproduction of R arm and forearm symptoms. Decreased R UE radiating symptoms after treatment to decrease muscle tension to R scalene and upper trap. Still demonstrates R proximal radioulnar joint discomfort with forearm supination and pronation. Pt tolerated session well without aggravation of symptoms. Pt will benefit from continued skilled physical therapy services to decrease pain, improve strength and function.    Personal Factors and Comorbidities Age;Comorbidity 3+;Time since onset of injury/illness/exacerbation    Comorbidities Prostate CA, kidney disease, DM, HTN    Examination-Activity Limitations Sleep;Other   moppoing, woodworking   Stability/Clinical Decision Making Stable/Uncomplicated    Rehab Potential Fair    PT Frequency 2x / week    PT Duration 4 weeks    PT Treatment/Interventions Manual techniques;Neuromuscular re-education;Therapeutic exercise;Therapeutic activities;Electrical Stimulation;Iontophoresis 35m/ml Dexamethasone;Patient/family education;Dry needling    PT Next Visit Plan isometric, concentric, eccentric contraction, manual techniques, modalities PRN    PT Home Exercise Plan Medbridge Access Code: [email or web address]   Consulted and Agree with Plan of Care Patient                 MJoneen BoersPT, DPT  04/27/2022, 10:44 AM

## 2022-04-29 ENCOUNTER — Ambulatory Visit: Payer: Medicare Other

## 2022-05-05 ENCOUNTER — Ambulatory Visit: Payer: Medicare Other

## 2022-05-05 DIAGNOSIS — M25521 Pain in right elbow: Secondary | ICD-10-CM | POA: Diagnosis not present

## 2022-05-05 DIAGNOSIS — M7711 Lateral epicondylitis, right elbow: Secondary | ICD-10-CM

## 2022-05-05 NOTE — Therapy (Signed)
OUTPATIENT PHYSICAL THERAPY TREATMENT NOTE    Patient Name: Scott Skinner MRN: [id number] DOB:Jun 10, 1950, 72 y.o., male Today's Date: 05/05/2022  PCP: [name], MD  REFERRING PROVIDER: Diamond Nickel, DO   END OF SESSION:  PT End of Session - 05/05/22 1505     Visit Number 13    Number of Visits 21    Date for PT Re-Evaluation 05/14/22    PT Start Time [id number]    PT Stop Time 1552    PT Time Calculation (min) 47 min    Activity Tolerance Patient tolerated treatment well    Behavior During Therapy WFL for tasks assessed/performed                      Past Medical History:  Diagnosis Date   Cancer ([place])    prostate cancer    Chronic kidney disease    Complication of anesthesia    anaphylaxis    Diabetes mellitus without complication (HCC)    GERD (gastroesophageal reflux disease)    Gout    History of kidney stones    Hyperlipidemia    Hypertension    Microvascular ischemia of myocardium    Obesity    OSA (obstructive sleep apnea)    Prostate cancer ([place])    Past Surgical History:  Procedure Laterality Date   COLONOSCOPY WITH PROPOFOL N/A 10/05/2016   Procedure: COLONOSCOPY WITH PROPOFOL;  Surgeon: [name], MD;  Location: [facility] ENDOSCOPY;  Service: Endoscopy;  Laterality: N/A;   COLONOSCOPY WITH PROPOFOL N/A 10/04/2019   Procedure: COLONOSCOPY WITH PROPOFOL;  Surgeon: Toledo, [name], MD;  Location: [facility] ENDOSCOPY;  Service: Gastroenterology;  Laterality: N/A;   CYST REMOVAL TRUNK  1977   ESOPHAGOGASTRODUODENOSCOPY (EGD) WITH PROPOFOL N/A 10/04/2019   Procedure: ESOPHAGOGASTRODUODENOSCOPY (EGD) WITH PROPOFOL;  Surgeon: Toledo, [name], MD;  Location: [facility] ENDOSCOPY;  Service: Gastroenterology;  Laterality: N/A;   LEFT HEART CATH AND CORONARY ANGIOGRAPHY Left 05/17/2019   Procedure: LEFT HEART CATH AND CORONARY ANGIOGRAPHY;  Surgeon: Corey Skains, MD;  Location: [place] CV LAB;  Service: Cardiovascular;  Laterality:  Left;   NECK SURGERY  2005   PROSTATE SURGERY     Patient Active Problem List   Diagnosis Date Noted   Personal history of gout 11/13/2020   History of prostate cancer 10/13/2019   CKD (chronic kidney disease) stage 3, GFR 30-59 ml/min (HCC) 06/05/2019   Microvascular ischemia of myocardium 06/01/2019   Angina pectoris ([place]) 05/10/2019   Encounter for general adult medical examination without abnormal findings 11/28/2018   Other obesity due to excess calories 07/26/2018   OSA on CPAP 11/30/2017   History of OCD (obsessive compulsive disorder) 06/01/2016   Mixed hypercholesterolemia and hypertriglyceridemia 04/20/2016   GERD without esophagitis 01/13/2016   Attention deficit disorder without hyperactivity 11/12/2015   Hypertension 05/14/2015   Diabetes mellitus type 2, uncontrolled, without complications [id number]   Hypercalcemia 05/14/2015   Abdominal pain, LLQ (left lower quadrant) 04/23/2015   ED (erectile dysfunction) of organic origin 10/21/2012   Incomplete bladder emptying 10/21/2012   Malignant neoplasm of prostate ([place]) 10/21/2012   Testicular hypofunction 10/21/2012   Bladder outlet obstruction 10/21/2012    REFERRING DIAG: R elbow pain, enthesopathy of R elbow, muscle spasm   THERAPY DIAG:  Pain in right elbow  Lateral epicondylitis of right elbow  Rationale for Evaluation and Treatment Rehabilitation  PERTINENT HISTORY: R elbow tendinitis. Pain also travels up to his R shoulder (R C5/C6 dermatome). Pain  calmed down since seeing Dr. Candelaria Stagers. Had x-rays with revealed a small spur but not in the area of pain. Pain began spring of 2023 due to a lot of sanding, sawing, using a screw driver with his woodwork. Pt is ambidextrous, but writes and eats with R hand.   PRECAUTIONS: No known precautions   SUBJECTIVE:   SUBJECTIVE STATEMENT: The sleeve has been helping. R elbow is not getting much better. Trying to limit the woodworking. The stretching helped alleviate it.  Trying to be aware when lifting the arm. Was 8-9/10 this morning when waking up. Usually the worst in the morning when waking. Woke up on his L side in the morning (most of the time he wakes up on his L side). Pt is a L sided sleeper in general. Feels it in his R anterior lateral elbow and anterior biceps (median nerve area). R dorsal lateral area has not been bothering him at all.      PAIN:  Are you having pain? 1-2/10 currently.    TODAY'S TREATMENT:                                                                                                                                         DATE: 05/05/2022     Therapeutic exercise   TTP R extensor carpi radialis brevis and longus.   Chin tucks 10x5 seconds   Supine R median nerve glide 10x3  No R anterior arm and elbow pain afterwards  (+) neural tension R UE with reproduction of symptoms median nerve test. Increases with L cervical side bending, decreases with R cervical side bending.   Supine cervical rotation 10x3 each direction Supine cervical side bend 10x3 each direction  Supine B scapular retraction 30 seconds x 3  Standing R biceps stretch at door frame 30 seconds x 2  Increased discomfort.      Improved exercise technique, movement at target joints, use of target muscles after min verbal, visual, tactile cues.          Response to treatment Fair tolerance to today's session. Decreased pain in supine. Increased pain with R biceps stretch.      Clinical impression  Posititve neural tension with reproduction of pain in R UE along biceps and anterior lateral elbow. Pt seems to demonstrate pain referral pattern along the levels above and below his cervical fusion (C4 and C7/T1) to his biceps and anterior lateral elbow as well as wrist extensor area which makes rehab for his tennis elbow challenging. Fair tolerance to today's session. Added cervical mobility exercises for pt to perform in the morning after waking up  to see if it helps with his pain.  Pt will benefit from continued skilled physical therapy services to decrease pain, improve strength and function.          PATIENT EDUCATION: Education details: there-ex Person educated: Patient Education method: Consulting civil engineer, Media planner, Corporate treasurer  cues, Verbal cues, and Handouts Education comprehension: verbalized understanding, returned demonstration, verbal cues required, and tactile cues required  HOME EXERCISE PROGRAM: Access Code: [email or web address] URL: [email or web address] Date: 03/03/2022 Prepared by: Joneen Boers  Exercises - Isometric Wrist Extension Pronated  - 1 x daily - 7 x weekly - 3 sets - 10 reps - 5 seconds hold - Seated Eccentric Wrist Extension  - 3 x daily - 7 x weekly - 1 sets - 10 reps   5 lbs (can use blue band provided as well)  - First Rib Mobilization with Strap  - 3 x daily - 7 x weekly - 1 sets - 3 reps - 30 seconds hold - Seated Cervical Retraction  - 1 x daily - 7 x weekly - 2 sets - 10 reps - 5 seconds  hold - Supine Cervical Rotation AROM on Pillow  - 1 x daily - 7 x weekly - 3 sets - 10 reps - Supine Cervical Sidebending  - 1 x daily - 7 x weekly - 3 sets - 10 reps   PT Short Term Goals - 04/14/22 1351       PT SHORT TERM GOAL #1   Title Pt will be independent with his initial HEP to improve strength, function, and decrease pain.    Baseline Pt performing HEP, no questions (04/14/2022)    Time 3    Period Weeks    Status Achieved    Target Date 03/19/22              PT Long Term Goals - 04/14/22 1352       PT LONG TERM GOAL #1   Title Pt will have a decrease in R lateral elbow pain to 4/10 or less at worst to promote ability to perform woodworking as well as use his R hand and wrist as well as perform chores such as mopping more comfortably.    Baseline 9/10 at worst for the past 3 months (02/24/2022); 5/10 at most for the past 7 days off and on (04/14/2022)    Time 4    Period Weeks     Status Partially Met    Target Date 05/14/22      PT LONG TERM GOAL #2   Title Pt will improve his FOTO score by at least 10 points as a demonstration of improved function.    Baseline R elbow FOTO 61 (02/24/2022); 60 (04/14/2022)    Time 4    Period Weeks    Status On-going    Target Date 05/14/22              Plan - 05/05/22 2059     Clinical Impression Statement Posititve neural tension with reproduction of pain in R UE along biceps and anterior lateral elbow. Pt seems to demonstrate pain referral pattern along the levels above and below his cervical fusion (C4 and C7/T1) to his biceps and anterior lateral elbow as well as wrist extensor area which makes rehab for his tennis elbow challenging. Fair tolerance to today's session. Added cervical mobility exercises for pt to perform in the morning after waking up to see if it helps with his pain.  Pt will benefit from continued skilled physical therapy services to decrease pain, improve strength and function.    Personal Factors and Comorbidities Age;Comorbidity 3+;Time since onset of injury/illness/exacerbation    Comorbidities Prostate CA, kidney disease, DM, HTN    Examination-Activity Limitations Sleep;Other   moppoing, woodworking   Stability/Clinical Decision Making Stable/Uncomplicated  Rehab Potential Fair    PT Frequency 2x / week    PT Duration 4 weeks    PT Treatment/Interventions Manual techniques;Neuromuscular re-education;Therapeutic exercise;Therapeutic activities;Electrical Stimulation;Iontophoresis 53m/ml Dexamethasone;Patient/family education;Dry needling    PT Next Visit Plan isometric, concentric, eccentric contraction, manual techniques, modalities PRN    PT Home Exercise Plan Medbridge Access Code: [email or web address]   Consulted and Agree with Plan of Care Patient                  MJoneen BoersPT, DPT  05/05/2022, 9:10 PM

## 2022-05-07 ENCOUNTER — Ambulatory Visit: Payer: Medicare Other

## 2022-05-12 ENCOUNTER — Ambulatory Visit: Payer: Medicare Other

## 2022-05-12 DIAGNOSIS — M7711 Lateral epicondylitis, right elbow: Secondary | ICD-10-CM

## 2022-05-12 DIAGNOSIS — M25521 Pain in right elbow: Secondary | ICD-10-CM
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
## 2022-05-14 ENCOUNTER — Ambulatory Visit: Payer: Medicare Other

## 2022-07-07 ENCOUNTER — Other Ambulatory Visit: Payer: Self-pay | Admitting: Neurosurgery

## 2022-07-07 DIAGNOSIS — M5412 Radiculopathy, cervical region: Secondary | ICD-10-CM

## 2022-07-27 ENCOUNTER — Ambulatory Visit
Admission: RE | Admit: 2022-07-27 | Discharge: 2022-07-27 | Disposition: A | Discharge status: Home / Self Care | Payer: PRIVATE HEALTH INSURANCE | Source: Ambulatory Visit | Attending: Neurosurgery | Admitting: Neurosurgery

## 2022-07-27 DIAGNOSIS — M5412 Radiculopathy, cervical region: Secondary | ICD-10-CM

## 2022-08-05 ENCOUNTER — Encounter: Payer: Self-pay | Admitting: Podiatry

## 2022-08-05 ENCOUNTER — Ambulatory Visit (INDEPENDENT_AMBULATORY_CARE_PROVIDER_SITE_OTHER): Payer: Medicare Other | Admitting: Podiatry

## 2022-08-05 DIAGNOSIS — E1142 Type 2 diabetes mellitus with diabetic polyneuropathy: Secondary | ICD-10-CM | POA: Diagnosis not present

## 2022-08-05 NOTE — Progress Notes (Signed)
He presents today after having not seen him for approximately 1 year with a chief concern of numbness and tingling in his feet which are worse at nighttime he states that it seems to be restricted to the balls of his feet and to the toes.  Has recently seen his endocrinologist who recommended that he follow-up with a podiatrist on a regular basis.  States that his last hemoglobin A1c was 6.9 and really does not get much higher than that.  Objective: Vital signs are stable alert and oriented x 3.  Pulses are palpable.  Digital hair growth is noted bilateral.  He has no open lesions or skin breakdown.  He does have a slight loss of sensation to the lesser toes of the left foot and to a lesser degree on the right foot.  Deep tendon reflexes are intact muscle strength is normal and symmetrical.  Orthopedic evaluation demonstrates all joint symptoms of the ankle full range of motion without crepitation.  No reproducible pain on palpation.  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  Plan: Diabetic podiatric education was discussed today thoroughly he will follow-up with his endocrinologist regularly who does his foot exam he says.  He will follow-up with [place] on an as-needed basis otherwise.

## 2022-08-18 ENCOUNTER — Other Ambulatory Visit: Payer: Self-pay | Admitting: Neurosurgery

## 2022-08-18 DIAGNOSIS — M5410 Radiculopathy, site unspecified: Secondary | ICD-10-CM

## 2022-09-01 ENCOUNTER — Ambulatory Visit
Admission: RE | Admit: 2022-09-01 | Discharge: 2022-09-01 | Disposition: A | Discharge status: Home / Self Care | Payer: Medicare Other | Source: Ambulatory Visit | Attending: Neurosurgery | Admitting: Neurosurgery

## 2022-09-01 DIAGNOSIS — M5410 Radiculopathy, site unspecified: Secondary | ICD-10-CM

## 2022-09-02 DIAGNOSIS — Z9189 Other specified personal risk factors, not elsewhere classified: Secondary | ICD-10-CM | POA: Insufficient documentation

## 2022-09-02 HISTORY — DX: Other specified personal risk factors, not elsewhere classified: Z91.89

## 2023-05-20 ENCOUNTER — Other Ambulatory Visit: Payer: Self-pay | Admitting: Student

## 2023-05-20 DIAGNOSIS — M47816 Spondylosis without myelopathy or radiculopathy, lumbar region: Secondary | ICD-10-CM

## 2023-06-01 ENCOUNTER — Other Ambulatory Visit: Payer: Self-pay | Admitting: Student

## 2023-06-01 ENCOUNTER — Ambulatory Visit
Admission: RE | Admit: 2023-06-01 | Discharge: 2023-06-01 | Discharge status: Home / Self Care | Source: Ambulatory Visit | Attending: Student

## 2023-06-01 DIAGNOSIS — M5412 Radiculopathy, cervical region: Secondary | ICD-10-CM

## 2023-06-01 DIAGNOSIS — M47816 Spondylosis without myelopathy or radiculopathy, lumbar region: Secondary | ICD-10-CM

## 2023-06-06 ENCOUNTER — Ambulatory Visit
Admission: RE | Admit: 2023-06-06 | Discharge: 2023-06-06 | Disposition: A | Discharge status: Home / Self Care | Source: Ambulatory Visit | Attending: Student

## 2023-06-06 DIAGNOSIS — M5412 Radiculopathy, cervical region: Secondary | ICD-10-CM

## 2023-06-09 ENCOUNTER — Other Ambulatory Visit: Payer: MEDICARE

## 2023-07-26 ENCOUNTER — Ambulatory Visit: Attending: Neurosurgery

## 2023-07-26 DIAGNOSIS — R293 Abnormal posture: Secondary | ICD-10-CM | POA: Insufficient documentation

## 2023-07-26 DIAGNOSIS — M542 Cervicalgia: Secondary | ICD-10-CM | POA: Diagnosis present

## 2023-07-26 DIAGNOSIS — M6281 Muscle weakness (generalized): Secondary | ICD-10-CM | POA: Insufficient documentation

## 2023-07-26 NOTE — Therapy (Signed)
 OUTPATIENT PHYSICAL THERAPY CERVICAL EVALUATION   Patient Name: TAIWAN MADDOCKS MRN: [id number] DOB:[birth year], 73 y.o., male Today's Date: 07/26/2023  END OF SESSION:  PT End of Session - 07/26/23 1033     Visit Number 1    Number of Visits 17    Date for PT Re-Evaluation 09/20/23    PT Start Time 1031    PT Stop Time 1115    PT Time Calculation (min) 44 min    Activity Tolerance Patient tolerated treatment well    Behavior During Therapy [facility] for tasks assessed/performed             Past Medical History:  Diagnosis Date   10 year risk of MI or stroke 7.5% or greater 09/02/2022   Cancer (HCC)    prostate cancer    Chronic kidney disease    Complication of anesthesia    anaphylaxis    Diabetes mellitus without complication (HCC)    GAD (generalized anxiety disorder) 02/13/2022   GERD (gastroesophageal reflux disease)    Gout    History of kidney stones    Hyperlipidemia    Hypertension    Microvascular ischemia of myocardium    Obesity    OSA (obstructive sleep apnea)    Prostate cancer (HCC)    Umbilical hernia without obstruction and without gangrene 12/02/2021   Past Surgical History:  Procedure Laterality Date   COLONOSCOPY WITH PROPOFOL  N/A 10/05/2016   Procedure: COLONOSCOPY WITH PROPOFOL ;  Surgeon: Deveron Fly, MD;  Location: [facility] ENDOSCOPY;  Service: Endoscopy;  Laterality: N/A;   COLONOSCOPY WITH PROPOFOL  N/A 10/04/2019   Procedure: COLONOSCOPY WITH PROPOFOL ;  Surgeon: Toledo, Alphonsus Jeans, MD;  Location: [facility] ENDOSCOPY;  Service: Gastroenterology;  Laterality: N/A;   CYST REMOVAL TRUNK  1977   ESOPHAGOGASTRODUODENOSCOPY (EGD) WITH PROPOFOL  N/A 10/04/2019   Procedure: ESOPHAGOGASTRODUODENOSCOPY (EGD) WITH PROPOFOL ;  Surgeon: Toledo, Alphonsus Jeans, MD;  Location: [facility] ENDOSCOPY;  Service: Gastroenterology;  Laterality: N/A;   LEFT HEART CATH AND CORONARY ANGIOGRAPHY Left 05/17/2019   Procedure: LEFT HEART CATH AND CORONARY ANGIOGRAPHY;  Surgeon: Michelle Aid, MD;  Location: [facility] INVASIVE CV LAB;  Service: Cardiovascular;  Laterality: Left;   NECK SURGERY  2005   PROSTATE SURGERY     Patient Active Problem List   Diagnosis Date Noted   Personal history of gout 11/13/2020   History of prostate cancer 10/13/2019   Stage 3a chronic kidney disease (HCC) 06/05/2019   Microvascular ischemia of myocardium 06/01/2019   Angina pectoris (HCC) 05/10/2019   Encounter for general adult medical examination without abnormal findings 11/28/2018   Class 1 obesity due to excess calories with serious comorbidity and body mass index (BMI) of 34.0 to 34.9 in adult 07/26/2018   OSA on CPAP 11/30/2017   History of OCD (obsessive compulsive disorder) 06/01/2016   Mixed hypercholesterolemia and hypertriglyceridemia 04/20/2016   GERD without esophagitis 01/13/2016   Attention deficit disorder without hyperactivity 11/12/2015   Hypertension 05/14/2015   Diabetes mellitus type 2, uncontrolled, without complications 05/14/2015   Hypercalcemia 05/14/2015   Abdominal pain, LLQ (left lower quadrant) 04/23/2015   Male erectile dysfunction, unspecified 10/21/2012   Incomplete bladder emptying 10/21/2012   Malignant neoplasm of prostate (HCC) 10/21/2012   Testicular hypofunction 10/21/2012   Bladder outlet obstruction 10/21/2012    PCP: Monique Ano, MD  REFERRING PROVIDER: Agustina Aldrich, MD  REFERRING DIAG: M54.12 (ICD-10-CM) - Radiculopathy, cervical region  THERAPY DIAG:  Cervicalgia  Abnormal posture  Muscle weakness (generalized)  Rationale for Evaluation  and Treatment: Rehabilitation  ONSET DATE: march 2024  SUBJECTIVE:                                                                                                                                                                                                         SUBJECTIVE STATEMENT: Pt is a pleasant 73 y.o. male referred to OPPT for cervical radiculopathy. Hand dominance:  Ambidextrous  PERTINENT HISTORY:  Pt reports 2 months of R sided neck pain and N/T in his neck. 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 He reports being a R/L side sleeper. Reports a lot of cervical flexion when wood working causing his pain. Has seen a chiropractor for L sided neck pain and has since stopped. Does endorse R sided headaches and L sided as well in rams head distribution R > L. Has tingling from upper trap/mastoid to distal upper trap. No UE N/T however. Worst pain 8/10 NPS, best pain, 0/10 NPS. Averages 3/10 NPS. Pain currently 3/10 NPS. Pain described as tightness primarily, also dull and achey. Pain improved with rest, keeping neck in a neutral position, massaging his neck muscles. Heat/ice helps.   PAIN:  Are you having pain? Yes: NPRS scale: 3/10 Pain location: R cervical paraspinals, upper trapezius.  Pain description: dull/achey/tingling Aggravating factors: cervical flexion, heavy lifting, sleep Relieving factors: rest/ice/heat  PRECAUTIONS: None  RED FLAGS: Cervical red flags: Dysphagia No, Dysmetria No, Diplopia No, Nystagmus No, and Nausea No, Bowel or bladder incontinence: No, and Cauda equina syndrome: No     WEIGHT BEARING RESTRICTIONS: No  FALLS:  Has patient fallen in last 6 months? No  LIVING ENVIRONMENT: Lives with: lives with their spouse  OCCUPATION: Retired  PLOF: Independent  PATIENT GOALS: improve his pain and get it controlled  NEXT MD VISIT: N/A  OBJECTIVE:  Note: Objective measures were completed at Evaluation unless otherwise noted.  DIAGNOSTIC FINDINGS:  CLINICAL DATA:  Radiculopathy   EXAM: MRI CERVICAL SPINE WITHOUT CONTRAST   TECHNIQUE: Multiplanar, multisequence MR imaging of the cervical spine was performed. No intravenous contrast was administered.   COMPARISON:  MRI of the cervical spine dated 07/27/2022   FINDINGS: Alignment: Physiologic.   Vertebrae: Anterior cervical fusion from C5 through C7. no  acute fracture is identified.   Cord: Normal signal and morphology.   Posterior Fossa, vertebral arteries, paraspinal tissues: The visualized portions of the skull base and the posterior fossa are normal. No soft tissue abnormality is identified.   Disc levels:   C2-C3: The disk is normal in configuration. Mild bilateral facet arthropathy. Mild  bilateral uncovertebral joint disease. Mild bilateral neuroforaminal stenosis. No spinal canal stenosis.   C3-C4: Disc osteophyte complex. Mild bilateral facet arthropathy. Severe bilateral uncovertebral joint disease. Severe bilateral neuroforaminal stenosis. Mild spinal canal stenosis.   C4-C5: Disc osteophyte complex. Severe right and mild left facet arthropathy. Moderate bilateral uncovertebral joint disease. Moderate bilateral neuroforaminal stenosis. No spinal canal stenosis.   C5-C6: Anterior fusion. No facet arthropathy. Moderate left uncovertebral joint disease. Moderate left neuroforaminal stenosis. No spinal canal stenosis.   C6-C7: Anterior fusion. Mild bilateral facet arthropathy. Mild uncovertebral joint disease. Mild left neuroforaminal stenosis. No spinal canal stenosis.   C7-T1: The disk is normal in configuration. Severe right and mild left facet arthropathy. Mild bilateral uncovertebral joint disease. Mild bilateral neuroforaminal stenosis. No spinal canal stenosis.   IMPRESSION: 1. Anterior cervical fusion from C5 through C7 2. Severe foraminal stenosis bilaterally at the C3-C4 secondary to uncovertebral joint disease. Moderate foraminal stenosis bilaterally at C4-C5. No high-grade canal stenosis.     Electronically Signed   By: Johnanna Mylar M.D.   On: 06/30/2023 10:54   PATIENT SURVEYS:  NDI 10/50  COGNITION: Overall cognitive status: Within functional limits for tasks assessed  SENSATION: WFL  POSTURE: rounded shoulders and forward head  PALPATION: TTP along R cervical paraspinals C2-C7 and R  upper trap muscle belly more distal.   CERVICAL ROM:   Active ROM A/PROM (deg) eval  Flexion 50*  Extension 45  Right lateral flexion 35*  Left lateral flexion 40  Right rotation 55  Left rotation 70   (Blank rows = not tested)  UPPER EXTREMITY ROM:  Active ROM Right eval Left eval  Shoulder flexion WNL WNL  Shoulder extension WNL WNL  Shoulder abduction WNL WNL  Shoulder adduction    Shoulder extension    Shoulder internal rotation    Shoulder external rotation    Elbow flexion    Elbow extension    Wrist flexion    Wrist extension    Wrist ulnar deviation    Wrist radial deviation    Wrist pronation    Wrist supination     (Blank rows = not tested)  UPPER EXTREMITY MMT:  MMT Right eval Left eval  Shoulder flexion 5 5  Shoulder extension 5 5  Shoulder abduction 5 5  Shoulder adduction    Shoulder extension 4 4  Shoulder internal rotation 5 5  Shoulder external rotation 4 4  Middle trapezius 4 4  Lower trapezius    Elbow flexion 5 5  Elbow extension 5 5  Wrist flexion 5 5  Wrist extension 5 5  Wrist ulnar deviation    Wrist radial deviation    Wrist pronation    Wrist supination    Grip strength     (Blank rows = not tested)  CERVICAL SPECIAL TESTS:  Spurling's test: Negative   JOINT MOBILITY: C2- T8 hypomobile with CPA's. Concordant pain with upper thoracic segments.  C2-C7 UPA's hypomobile. No concordant pain.   FUNCTIONAL TESTS:  DNF endurance test: 1 minute  TREATMENT DATE: 07/26/23  Eval only   PATIENT EDUCATION:  Education details: HEP, POC, Prognosis Person educated: Patient Education method: Explanation, Demonstration, and Handouts Education comprehension: verbalized understanding and needs further education  HOME EXERCISE PROGRAM: Access Code: [email or web address] URL: [email or web address] Date:  07/26/2023 Prepared by: Lyda Samples  Exercises - Supine Chin Tuck  - 1 x daily - 7 x weekly - 2 sets - 12 reps - Seated Scapular Retraction  - 1 x daily - 7 x weekly - 2 sets - 12 reps - Seated Thoracic Lumbar Extension  - 1 x daily - 7 x weekly - 2 sets - 12 reps  ASSESSMENT:  CLINICAL IMPRESSION: Patient is a 73 y.o. male who was seen today for physical therapy evaluation and treatment for cervical radiculopathy. Pt does not present with positive cluster testing for cervical radiculopathy. Pt does present with significant cervical and thoracic ROM deficits and hypomobility with concordant pain along with forward head and rounded shoulder posture. These deficits are impacting pt's ability to sleep and perform recreational carpentry tasks without moderate to severe pain. Pt will benefit from skilled PT services to address these deficits and maximize return to PLOF.   OBJECTIVE IMPAIRMENTS: decreased mobility, decreased ROM, decreased strength, hypomobility, improper body mechanics, postural dysfunction, and pain.   ACTIVITY LIMITATIONS: carrying, lifting, standing, and sleeping  PARTICIPATION LIMITATIONS: occupation and carpentry  PERSONAL FACTORS: Age, Behavior pattern, Fitness, Past/current experiences, and Time since onset of injury/illness/exacerbation are also affecting patient's functional outcome.   REHAB POTENTIAL: Good  CLINICAL DECISION MAKING: Stable/uncomplicated  EVALUATION COMPLEXITY: Low   GOALS: Goals reviewed with patient? No  SHORT TERM GOALS: Target date: 08/23/23  Pt will be independent with HEP to improve cervical/thoracic ROM and pain with ADL's. Baseline: 07/26/23: provided HEP Goal status: INITIAL  LONG TERM GOALS: Target date: 09/20/23  Pt will improve NDI by at least 8 points to demo clinically significant improvement with disability from cervicalgia.  Baseline: 07/26/23: 10/50 Goal status: INITIAL  2.  Pt will report worst pain as a 2/10 NPS or less  with carpentry to demo clinically significant improvement in pain with recreational activity.  Baseline: 07/26/23: worst: 8/10 NPS.  Goal status: INITIAL  3.  Pt will report no neck pain with 6-8 hours of sleep in side lying to improve QoL.  Baseline: 07/26/23: 3/10 NPS average with sleep. Goal status: INITIAL  4.  Pt will improve cervical AROM to WNL in all planes to demonstrate improved cervical AROM and pain with ADL's.  Baseline: 07/26/23:   Active ROM A/PROM (deg) eval  Flexion 50*  Extension 45  Right lateral flexion 35*  Left lateral flexion 40  Right rotation 55  Left rotation 70   Goal status: INITIAL PLAN:  PT FREQUENCY: 1-2x/week  PT DURATION: 8 weeks  PLANNED INTERVENTIONS: [id number]- PT Re-evaluation, 97110-Therapeutic exercises, 97530- Therapeutic activity, 97112- Neuromuscular re-education, 97535- Self Care, [id number]- Manual therapy, [id number]- Electrical stimulation (unattended), [phone]- Electrical stimulation (manual), Patient/Family education, Dry Needling, Joint mobilization, Joint manipulation, Spinal manipulation, Spinal mobilization, Cryotherapy, and Moist heat  PLAN FOR NEXT SESSION: Progress HEP. Cervical and thoracic mobility, postural strengthening.    Marc Senior. Fairly IV, PT, DPT Physical Therapist- [facility]  [facility]  07/26/2023, 12:57 PM

## 2023-07-28 ENCOUNTER — Ambulatory Visit

## 2023-07-28 ENCOUNTER — Encounter: Payer: Self-pay | Admitting: Physical Therapy

## 2023-07-28 DIAGNOSIS — R293 Abnormal posture: Secondary | ICD-10-CM

## 2023-07-28 DIAGNOSIS — M542 Cervicalgia: Secondary | ICD-10-CM | POA: Diagnosis not present

## 2023-07-28 DIAGNOSIS — M6281 Muscle weakness (generalized): Secondary | ICD-10-CM

## 2023-07-28 NOTE — Therapy (Signed)
 OUTPATIENT PHYSICAL THERAPY CERVICAL TREATMENT   Patient Name: Scott Skinner MRN: [id number] DOB:Aug 27, 1950, 73 y.o., male Today's Date: 07/28/2023  END OF SESSION:  PT End of Session - 07/28/23 1034     Visit Number 2    Number of Visits 17    Date for PT Re-Evaluation 09/20/23    PT Start Time 1031    PT Stop Time 1114    PT Time Calculation (min) 43 min    Activity Tolerance Patient tolerated treatment well    Behavior During Therapy [facility] for tasks assessed/performed             Past Medical History:  Diagnosis Date   10 year risk of MI or stroke 7.5% or greater 09/02/2022   Cancer (HCC)    prostate cancer    Chronic kidney disease    Complication of anesthesia    anaphylaxis    Diabetes mellitus without complication (HCC)    GAD (generalized anxiety disorder) 02/13/2022   GERD (gastroesophageal reflux disease)    Gout    History of kidney stones    Hyperlipidemia    Hypertension    Microvascular ischemia of myocardium    Obesity    OSA (obstructive sleep apnea)    Prostate cancer (HCC)    Umbilical hernia without obstruction and without gangrene 12/02/2021   Past Surgical History:  Procedure Laterality Date   COLONOSCOPY WITH PROPOFOL  N/A 10/05/2016   Procedure: COLONOSCOPY WITH PROPOFOL ;  Surgeon: Deveron Fly, MD;  Location: [facility] ENDOSCOPY;  Service: Endoscopy;  Laterality: N/A;   COLONOSCOPY WITH PROPOFOL  N/A 10/04/2019   Procedure: COLONOSCOPY WITH PROPOFOL ;  Surgeon: Toledo, Alphonsus Jeans, MD;  Location: [facility] ENDOSCOPY;  Service: Gastroenterology;  Laterality: N/A;   CYST REMOVAL TRUNK  1977   ESOPHAGOGASTRODUODENOSCOPY (EGD) WITH PROPOFOL  N/A 10/04/2019   Procedure: ESOPHAGOGASTRODUODENOSCOPY (EGD) WITH PROPOFOL ;  Surgeon: Toledo, Alphonsus Jeans, MD;  Location: [facility] ENDOSCOPY;  Service: Gastroenterology;  Laterality: N/A;   LEFT HEART CATH AND CORONARY ANGIOGRAPHY Left 05/17/2019   Procedure: LEFT HEART CATH AND CORONARY ANGIOGRAPHY;  Surgeon: Michelle Aid, MD;  Location: [facility] INVASIVE CV LAB;  Service: Cardiovascular;  Laterality: Left;   NECK SURGERY  2005   PROSTATE SURGERY     Patient Active Problem List   Diagnosis Date Noted   Personal history of gout 11/13/2020   History of prostate cancer 10/13/2019   Stage 3a chronic kidney disease (HCC) 06/05/2019   Microvascular ischemia of myocardium 06/01/2019   Angina pectoris (HCC) 05/10/2019   Encounter for general adult medical examination without abnormal findings 11/28/2018   Class 1 obesity due to excess calories with serious comorbidity and body mass index (BMI) of 34.0 to 34.9 in adult 07/26/2018   OSA on CPAP 11/30/2017   History of OCD (obsessive compulsive disorder) 06/01/2016   Mixed hypercholesterolemia and hypertriglyceridemia 04/20/2016   GERD without esophagitis 01/13/2016   Attention deficit disorder without hyperactivity 11/12/2015   Hypertension 05/14/2015   Diabetes mellitus type 2, uncontrolled, without complications 05/14/2015   Hypercalcemia 05/14/2015   Abdominal pain, LLQ (left lower quadrant) 04/23/2015   Male erectile dysfunction, unspecified 10/21/2012   Incomplete bladder emptying 10/21/2012   Malignant neoplasm of prostate (HCC) 10/21/2012   Testicular hypofunction 10/21/2012   Bladder outlet obstruction 10/21/2012    PCP: Monique Ano, MD  REFERRING PROVIDER: Agustina Aldrich, MD  REFERRING DIAG: M54.12 (ICD-10-CM) - Radiculopathy, cervical region  THERAPY DIAG:  Cervicalgia  Abnormal posture  Muscle weakness (generalized)  Rationale for Evaluation  and Treatment: Rehabilitation  ONSET DATE: march 2024  SUBJECTIVE:                                                                                                                                                                                                         SUBJECTIVE STATEMENT: Pt reports HEP compliance. Pain 1/10 NPS in neck.  Hand dominance: Ambidextrous  PERTINENT HISTORY:   Pt reports 2 months of R sided neck pain and N/T in his neck. 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 He reports being a R/L side sleeper. Reports a lot of cervical flexion when wood working causing his pain. Has seen a chiropractor for L sided neck pain and has since stopped. Does endorse R sided headaches and L sided as well in rams head distribution R > L. Has tingling from upper trap/mastoid to distal upper trap. No UE N/T however. Worst pain 8/10 NPS, best pain, 0/10 NPS. Averages 3/10 NPS. Pain currently 3/10 NPS. Pain described as tightness primarily, also dull and achey. Pain improved with rest, keeping neck in a neutral position, massaging his neck muscles. Heat/ice helps.   PAIN:  Are you having pain? Yes: NPRS scale: 1/10 Pain location: R cervical paraspinals, upper trapezius.  Pain description: dull/achey/tingling Aggravating factors: cervical flexion, heavy lifting, sleep Relieving factors: rest/ice/heat  PRECAUTIONS: None  RED FLAGS: Cervical red flags: Dysphagia No, Dysmetria No, Diplopia No, Nystagmus No, and Nausea No, Bowel or bladder incontinence: No, and Cauda equina syndrome: No     WEIGHT BEARING RESTRICTIONS: No  FALLS:  Has patient fallen in last 6 months? No  LIVING ENVIRONMENT: Lives with: lives with their spouse  OCCUPATION: Retired  PLOF: Independent  PATIENT GOALS: improve his pain and get it controlled  NEXT MD VISIT: N/A  OBJECTIVE:  Note: Objective measures were completed at Evaluation unless otherwise noted.  DIAGNOSTIC FINDINGS:  CLINICAL DATA:  Radiculopathy   EXAM: MRI CERVICAL SPINE WITHOUT CONTRAST   TECHNIQUE: Multiplanar, multisequence MR imaging of the cervical spine was performed. No intravenous contrast was administered.   COMPARISON:  MRI of the cervical spine dated 07/27/2022   FINDINGS: Alignment: Physiologic.   Vertebrae: Anterior cervical fusion from C5 through C7. no acute fracture is identified.   Cord:  Normal signal and morphology.   Posterior Fossa, vertebral arteries, paraspinal tissues: The visualized portions of the skull base and the posterior fossa are normal. No soft tissue abnormality is identified.   Disc levels:   C2-C3: The disk is normal in configuration. Mild bilateral facet arthropathy. Mild bilateral uncovertebral joint  disease. Mild bilateral neuroforaminal stenosis. No spinal canal stenosis.   C3-C4: Disc osteophyte complex. Mild bilateral facet arthropathy. Severe bilateral uncovertebral joint disease. Severe bilateral neuroforaminal stenosis. Mild spinal canal stenosis.   C4-C5: Disc osteophyte complex. Severe right and mild left facet arthropathy. Moderate bilateral uncovertebral joint disease. Moderate bilateral neuroforaminal stenosis. No spinal canal stenosis.   C5-C6: Anterior fusion. No facet arthropathy. Moderate left uncovertebral joint disease. Moderate left neuroforaminal stenosis. No spinal canal stenosis.   C6-C7: Anterior fusion. Mild bilateral facet arthropathy. Mild uncovertebral joint disease. Mild left neuroforaminal stenosis. No spinal canal stenosis.   C7-T1: The disk is normal in configuration. Severe right and mild left facet arthropathy. Mild bilateral uncovertebral joint disease. Mild bilateral neuroforaminal stenosis. No spinal canal stenosis.   IMPRESSION: 1. Anterior cervical fusion from C5 through C7 2. Severe foraminal stenosis bilaterally at the C3-C4 secondary to uncovertebral joint disease. Moderate foraminal stenosis bilaterally at C4-C5. No high-grade canal stenosis.     Electronically Signed   By: Johnanna Mylar M.D.   On: 06/30/2023 10:54   PATIENT SURVEYS:  NDI 10/50  COGNITION: Overall cognitive status: Within functional limits for tasks assessed  SENSATION: WFL  POSTURE: rounded shoulders and forward head  PALPATION: TTP along R cervical paraspinals C2-C7 and R upper trap muscle belly more  distal.   CERVICAL ROM:   Active ROM A/PROM (deg) eval  Flexion 50*  Extension 45  Right lateral flexion 35*  Left lateral flexion 40  Right rotation 55  Left rotation 70   (Blank rows = not tested)  UPPER EXTREMITY ROM:  Active ROM Right eval Left eval  Shoulder flexion WNL WNL  Shoulder extension WNL WNL  Shoulder abduction WNL WNL  Shoulder adduction    Shoulder extension    Shoulder internal rotation    Shoulder external rotation    Elbow flexion    Elbow extension    Wrist flexion    Wrist extension    Wrist ulnar deviation    Wrist radial deviation    Wrist pronation    Wrist supination     (Blank rows = not tested)  UPPER EXTREMITY MMT:  MMT Right eval Left eval  Shoulder flexion 5 5  Shoulder extension 5 5  Shoulder abduction 5 5  Shoulder adduction    Shoulder extension 4 4  Shoulder internal rotation 5 5  Shoulder external rotation 4 4  Middle trapezius 4 4  Lower trapezius    Elbow flexion 5 5  Elbow extension 5 5  Wrist flexion 5 5  Wrist extension 5 5  Wrist ulnar deviation    Wrist radial deviation    Wrist pronation    Wrist supination    Grip strength     (Blank rows = not tested)  CERVICAL SPECIAL TESTS:  Spurling's test: Negative   JOINT MOBILITY: C2- T8 hypomobile with CPA's. Concordant pain with upper thoracic segments.  C2-C7 UPA's hypomobile. No concordant pain.   FUNCTIONAL TESTS:  DNF endurance test: 1 minute  TREATMENT DATE: 07/26/23  There.ex:  UBE L4 for 2 min forward/ 2 min backward for UE warm up    Reviewed HEP:    Seated cervical retractions: 2x12. Good form/technique    Scap retractions standing with black TB:    Seated thoracic extension in chair: 2x12. Pillow at lumbar spine.   Seated cervical SNAGS:   Extension: 2x12  Rotation R/L: x12/side  Side lying open books: x12/side.  PT demo prior to completion. Good form/technique.   Door way pec stretch: 3x30 sec. Each rep with varying hand/shoulder placement.   PATIENT EDUCATION:  Education details: HEP, POC, Prognosis Person educated: Patient Education method: Explanation, Demonstration, and Handouts Education comprehension: verbalized understanding and needs further education  HOME EXERCISE PROGRAM: Access Code: [email or web address] URL: [email or web address] Date: 07/28/2023 Prepared by: Lyda Samples  Exercises - Supine Chin Tuck  - 1 x daily - 7 x weekly - 2 sets - 12 reps - Seated Scapular Retraction  - 1 x daily - 7 x weekly - 2 sets - 12 reps - Seated Thoracic Lumbar Extension  - 1 x daily - 7 x weekly - 2 sets - 12 reps - Seated Assisted Cervical Rotation with Towel  - 1 x daily - 7 x weekly - 1 sets - 12 reps - Cervical Extension AROM with Strap  - 1 x daily - 7 x weekly - 1 sets - 12 reps  Access Code: [email or web address] URL: [email or web address] Date: 07/26/2023 Prepared by: Lyda Samples  Exercises - Supine Chin Tuck  - 1 x daily - 7 x weekly - 2 sets - 12 reps - Seated Scapular Retraction  - 1 x daily - 7 x weekly - 2 sets - 12 reps - Seated Thoracic Lumbar Extension  - 1 x daily - 7 x weekly - 2 sets - 12 reps  ASSESSMENT:  CLINICAL IMPRESSION: Pt arriving for first treatment with reduced pain and excellent understanding of HEP. Able to progress periscapular strength training and cervical mobility without issue. Updated HEP again accordingly. Discussed positional changes with carpentry tasks to mitigate neck pain due to limited ergonomic adjustments available based off of equipment and pt's height. Pt will benefit from skilled PT services to address these deficits and maximize return to PLOF.   OBJECTIVE IMPAIRMENTS: decreased mobility, decreased ROM, decreased strength, hypomobility, improper body mechanics, postural dysfunction, and pain.   ACTIVITY LIMITATIONS: carrying,  lifting, standing, and sleeping  PARTICIPATION LIMITATIONS: occupation and carpentry  PERSONAL FACTORS: Age, Behavior pattern, Fitness, Past/current experiences, and Time since onset of injury/illness/exacerbation are also affecting patient's functional outcome.   REHAB POTENTIAL: Good  CLINICAL DECISION MAKING: Stable/uncomplicated  EVALUATION COMPLEXITY: Low   GOALS: Goals reviewed with patient? No  SHORT TERM GOALS: Target date: 08/23/23  Pt will be independent with HEP to improve cervical/thoracic ROM and pain with ADL's. Baseline: 07/26/23: provided HEP Goal status: INITIAL  LONG TERM GOALS: Target date: 09/20/23  Pt will improve NDI by at least 8 points to demo clinically significant improvement with disability from cervicalgia.  Baseline: 07/26/23: 10/50 Goal status: INITIAL  2.  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  Baseline: 07/26/23: worst: 8/10 NPS.  Goal status: INITIAL  3.  Pt will report no neck pain with 6-8 hours of sleep in side lying to improve QoL.  Baseline: 07/26/23: 3/10 NPS average with sleep. Goal status: INITIAL  4.  Pt will improve cervical AROM to WNL in all planes  to demonstrate improved cervical AROM and pain with ADL's.  Baseline: 07/26/23:   Active ROM A/PROM (deg) eval  Flexion 50*  Extension 45  Right lateral flexion 35*  Left lateral flexion 40  Right rotation 55  Left rotation 70   Goal status: INITIAL PLAN:  PT FREQUENCY: 1-2x/week  PT DURATION: 8 weeks  PLANNED INTERVENTIONS: [id number]- PT Re-evaluation, 97110-Therapeutic exercises, 97530- Therapeutic activity, 97112- Neuromuscular re-education, 97535- Self Care, [id number]- Manual therapy, [id number]- Electrical stimulation (unattended), [phone]- Electrical stimulation (manual), Patient/Family education, Dry Needling, Joint mobilization, Joint manipulation, Spinal manipulation, Spinal  mobilization, Cryotherapy, and Moist heat  PLAN FOR NEXT SESSION: Progress HEP. Cervical and thoracic mobility, postural strengthening.    Marc Senior. Fairly IV, PT, DPT Physical Therapist- [place] [facility]  07/28/2023, 12:58 PM

## 2023-08-02 ENCOUNTER — Encounter: Payer: Self-pay | Admitting: Physical Therapy

## 2023-08-02 ENCOUNTER — Ambulatory Visit: Admitting: Physical Therapy

## 2023-08-02 VITALS — BP 125/63 | HR 69

## 2023-08-02 DIAGNOSIS — M6281 Muscle weakness (generalized): Secondary | ICD-10-CM

## 2023-08-02 DIAGNOSIS — M542 Cervicalgia: Secondary | ICD-10-CM

## 2023-08-02 DIAGNOSIS — R293 Abnormal posture: Secondary | ICD-10-CM

## 2023-08-02 NOTE — Therapy (Signed)
 OUTPATIENT PHYSICAL THERAPY TREATMENT   Patient Name: Scott Skinner MRN: [id number] DOB:Nov 27, 1950, 73 y.o., male Today's Date: 08/02/2023  END OF SESSION:  PT End of Session - 08/02/23 1927     Visit Number 3    Number of Visits 17    Date for PT Re-Evaluation 09/20/23    Authorization Type MEDICARE PART B reporting period from 07/26/2023    Progress Note Due on Visit 10    PT Start Time 1032    PT Stop Time 1120    PT Time Calculation (min) 48 min    Activity Tolerance Patient tolerated treatment well    Behavior During Therapy [facility] for tasks assessed/performed              Past Medical History:  Diagnosis Date   10 year risk of MI or stroke 7.5% or greater 09/02/2022   Cancer (HCC)    prostate cancer    Chronic kidney disease    Complication of anesthesia    anaphylaxis    Diabetes mellitus without complication (HCC)    GAD (generalized anxiety disorder) 02/13/2022   GERD (gastroesophageal reflux disease)    Gout    History of kidney stones    Hyperlipidemia    Hypertension    Microvascular ischemia of myocardium    Obesity    OSA (obstructive sleep apnea)    Prostate cancer (HCC)    Umbilical hernia without obstruction and without gangrene 12/02/2021   Past Surgical History:  Procedure Laterality Date   COLONOSCOPY WITH PROPOFOL  N/A 10/05/2016   Procedure: COLONOSCOPY WITH PROPOFOL ;  Surgeon: Deveron Fly, MD;  Location: [facility] ENDOSCOPY;  Service: Endoscopy;  Laterality: N/A;   COLONOSCOPY WITH PROPOFOL  N/A 10/04/2019   Procedure: COLONOSCOPY WITH PROPOFOL ;  Surgeon: Toledo, Alphonsus Jeans, MD;  Location: [facility] ENDOSCOPY;  Service: Gastroenterology;  Laterality: N/A;   CYST REMOVAL TRUNK  1977   ESOPHAGOGASTRODUODENOSCOPY (EGD) WITH PROPOFOL  N/A 10/04/2019   Procedure: ESOPHAGOGASTRODUODENOSCOPY (EGD) WITH PROPOFOL ;  Surgeon: Toledo, Alphonsus Jeans, MD;  Location: [facility] ENDOSCOPY;  Service: Gastroenterology;  Laterality: N/A;   LEFT HEART CATH AND CORONARY  ANGIOGRAPHY Left 05/17/2019   Procedure: LEFT HEART CATH AND CORONARY ANGIOGRAPHY;  Surgeon: Michelle Aid, MD;  Location: [facility] INVASIVE CV LAB;  Service: Cardiovascular;  Laterality: Left;   NECK SURGERY  2005   PROSTATE SURGERY     Patient Active Problem List   Diagnosis Date Noted   Personal history of gout 11/13/2020   History of prostate cancer 10/13/2019   Stage 3a chronic kidney disease (HCC) 06/05/2019   Microvascular ischemia of myocardium 06/01/2019   Angina pectoris (HCC) 05/10/2019   Encounter for general adult medical examination without abnormal findings 11/28/2018   Class 1 obesity due to excess calories with serious comorbidity and body mass index (BMI) of 34.0 to 34.9 in adult 07/26/2018   OSA on CPAP 11/30/2017   History of OCD (obsessive compulsive disorder) 06/01/2016   Mixed hypercholesterolemia and hypertriglyceridemia 04/20/2016   GERD without esophagitis 01/13/2016   Attention deficit disorder without hyperactivity 11/12/2015   Hypertension 05/14/2015   Diabetes mellitus type 2, uncontrolled, without complications 05/14/2015   Hypercalcemia 05/14/2015   Abdominal pain, LLQ (left lower quadrant) 04/23/2015   Male erectile dysfunction, unspecified 10/21/2012   Incomplete bladder emptying 10/21/2012   Malignant neoplasm of prostate (HCC) 10/21/2012   Testicular hypofunction 10/21/2012   Bladder outlet obstruction 10/21/2012    PCP: Monique Ano, MD  REFERRING PROVIDER: Agustina Aldrich, MD  REFERRING DIAG: [phone] (  ICD-10-CM) - Radiculopathy, cervical region  THERAPY DIAG:  Cervicalgia  Abnormal posture  Muscle weakness (generalized)  Rationale for Evaluation and Treatment: Rehabilitation  ONSET DATE: march 2024  SUBJECTIVE:                                                                                                                                                                                                         PERTINENT HISTORY:  Pt  reports 2 months of R sided neck pain and N/T in his neck. 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 He reports being a R/L side sleeper. Reports a lot of cervical flexion when wood working causing his pain. Has seen a chiropractor for L sided neck pain and has since stopped. Does endorse R sided headaches and L sided as well in rams head distribution R > L. Has tingling from upper trap/mastoid to distal upper trap. No UE N/T however. Worst pain 8/10 NPS, best pain, 0/10 NPS. Averages 3/10 NPS. Pain currently 3/10 NPS. Pain described as tightness primarily, also dull and achey. Pain improved with rest, keeping neck in a neutral position, massaging his neck muscles. Heat/ice helps.   RED FLAGS: Cervical red flags: Dysphagia No, Dysmetria No, Diplopia No, Nystagmus No, and Nausea No, Bowel or bladder incontinence: No, and Cauda equina syndrome: No     SUBJECTIVE STATEMENT: Patient states he continues to get tingling over his right upper trap. He states it comes and goes.   PAIN:  NPRS scale: current 3/10 right UT region Pain location: R cervical paraspinals, upper trapezius.  Pain description: dull/achey/tingling Aggravating factors: cervical flexion, heavy lifting, sleep Relieving factors: rest/ice/heat  PRECAUTIONS: None  PATIENT GOALS: improve his pain and get it controlled  NEXT MD VISIT: N/A  OBJECTIVE:   Vitals:   08/02/23 1040  BP: 125/63  Pulse: 69  SpO2: 98%  Automatic cuff   TREATMENT  Therapeutic exercise: therapeutic exercises that incorporate ONE parameter at one or more areas of the body to centralize symptoms, develop strength and endurance, range of motion, and flexibility required for successful completion of functional activities.  Vitals measurement for systems review (see above)  Seated cervical SNAGS Rotational: 1x5 each  direction with entire pillow case 1x5-10 each direction at upper and 1/2 inch lower than that with edge of pillow case Education on how to use edge of pillow case to get more specific segmental stretch.   Education on interventions and update to HEP  Manual therapy: to reduce pain and tissue tension, improve range of motion, neuromodulation, in order to promote improved ability to complete functional activities. PRONE STM to bilateral suboccipitals, cervical paraspinals, and UT  Trigger Point Dry Needling  Initial Treatment: Pt instructed on Dry Needling rational, procedures, and possible side effects. Pt instructed to expect mild to moderate muscle soreness later in the day and/or into the next day.  Pt instructed in methods to reduce muscle soreness. Pt instructed to continue prescribed HEP. Because Dry Needling was performed over or adjacent to a lung field, pt was educated on S/S of pneumothorax and to seek immediate medical attention should they occur.  Patient was educated on signs and symptoms of infection and other risk factors and advised to seek medical attention should they occur.  Patient verbalized understanding of these instructions and education.   Patient Verbal Consent Given: Yes Education Handout Provided: Yes Muscles Treated: 2 dry needle(s) .30mm x 60mm inserted with 1 stick each side into suboccipitals and 1 sticks into right UT muscles with patient in prone position to decrease pain and spasms along patient's neck and head region.  Electrical Stimulation Performed: No Treatment Response/Outcome: Patient with reproduction of symptoms at right suboccipital location and moderate twitch response at right UT. Well tolerated without event.      PATIENT EDUCATION:  Education details: HEP, POC, Prognosis Person educated: Patient Education method: Explanation, Demonstration, and Handouts Education comprehension: verbalized understanding and needs further  education  HOME EXERCISE PROGRAM: Access Code: [email or web address] URL: [email or web address] Date: 07/28/2023 Prepared by: Lyda Samples  Exercises - Supine Chin Tuck  - 1 x daily - 7 x weekly - 2 sets - 12 reps - Seated Scapular Retraction  - 1 x daily - 7 x weekly - 2 sets - 12 reps - Seated Thoracic Lumbar Extension  - 1 x daily - 7 x weekly - 2 sets - 12 reps - Seated Assisted Cervical Rotation with Towel  - 1 x daily - 7 x weekly - 1 sets - 12 reps - Cervical Extension AROM with Strap  - 1 x daily - 7 x weekly - 1 sets - 12 reps  Access Code: [email or web address] URL: [email or web address] Date: 07/26/2023 Prepared by: Lyda Samples  Exercises - Supine Chin Tuck  - 1 x daily - 7 x weekly - 2 sets - 12 reps - Seated Scapular Retraction  - 1 x daily - 7 x weekly - 2 sets - 12 reps - Seated Thoracic Lumbar Extension  - 1 x daily - 7 x weekly - 2 sets - 12 reps  ASSESSMENT:  CLINICAL IMPRESSION: Patient with continued pain at right upper trap and along right side of head, so trial of dry needling to help decrease muscle tension and pain was used. Patient had no lingering soreness from the the dry needling but also no dramatic improvement either. He does demonstrate very stiff joints in the neck and thoracic spine  and would benefit from continued exercises and manual therapy to address these. Patient would benefit from continued management of limiting condition by skilled physical therapist to address remaining impairments and functional limitations to work towards stated goals and return to PLOF or maximal functional independence.    OBJECTIVE IMPAIRMENTS: decreased mobility, decreased ROM, decreased strength, hypomobility, improper body mechanics, postural dysfunction, and pain.   ACTIVITY LIMITATIONS: carrying, lifting, standing, and sleeping  PARTICIPATION LIMITATIONS: occupation and carpentry  PERSONAL FACTORS: Age, Behavior pattern, Fitness, Past/current experiences,  and Time since onset of injury/illness/exacerbation are also affecting patient's functional outcome.   REHAB POTENTIAL: Good  CLINICAL DECISION MAKING: Stable/uncomplicated  EVALUATION COMPLEXITY: Low   GOALS: Goals reviewed with patient? No  SHORT TERM GOALS: Target date: 08/23/23  Pt will be independent with HEP to improve cervical/thoracic ROM and pain with ADL's. Baseline: 07/26/23: provided HEP Goal status: In progress  LONG TERM GOALS: Target date: 09/20/23  Pt will improve NDI by at least 8 points to demo clinically significant improvement with disability from cervicalgia.  Baseline: 07/26/23: 10/50 Goal status: In progress  2.  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  Baseline: 07/26/23: worst: 8/10 NPS.  Goal status: In progress  3.  Pt will report no neck pain with 6-8 hours of sleep in side lying to improve QoL.  Baseline: 07/26/23: 3/10 NPS average with sleep. Goal status: In progress  4.  Pt will improve cervical AROM to WNL in all planes to demonstrate improved cervical AROM and pain with ADL's.  Baseline: 07/26/23:   Active ROM A/PROM (deg) eval  Flexion 50*  Extension 45  Right lateral flexion 35*  Left lateral flexion 40  Right rotation 55  Left rotation 70   Goal status: In progress PLAN:  PT FREQUENCY: 1-2x/week  PT DURATION: 8 weeks  PLANNED INTERVENTIONS: 97164- PT Re-evaluation, 97110-Therapeutic exercises, 97530- Therapeutic activity, [id number]- Neuromuscular re-education, 97535- Self Care, 84132- Manual therapy, G0283- Electrical stimulation (unattended), [id number]- Electrical stimulation (manual), Patient/Family education, Dry Needling, Joint mobilization, Joint manipulation, Spinal manipulation, Spinal mobilization, Cryotherapy, and Moist heat  PLAN FOR NEXT SESSION: Progress HEP. Cervical and thoracic mobility, postural strengthening.    Carilyn Charles.  Artemio Larry, PT, DPT 08/02/23, 7:39 PM  [facility] [address] [place], [place] [address] P: [phone] I F: [phone]

## 2023-08-02 NOTE — Patient Instructions (Signed)

## 2023-08-04 ENCOUNTER — Ambulatory Visit: Admitting: Physical Therapy

## 2023-08-04 DIAGNOSIS — M6281 Muscle weakness (generalized): Secondary | ICD-10-CM

## 2023-08-04 DIAGNOSIS — M542 Cervicalgia: Secondary | ICD-10-CM | POA: Diagnosis not present

## 2023-08-04 DIAGNOSIS — R293 Abnormal posture: Secondary | ICD-10-CM

## 2023-08-04 NOTE — Therapy (Signed)
 OUTPATIENT PHYSICAL THERAPY TREATMENT   Patient Name: Scott Skinner MRN: [id number] DOB:[birth year], 73 y.o., male Today's Date: 08/04/2023  END OF SESSION:  PT End of Session - 08/04/23 1636     Visit Number 4    Number of Visits 17    Date for PT Re-Evaluation 09/20/23    Authorization Type MEDICARE PART B reporting period from 07/26/2023    Progress Note Due on Visit 10    PT Start Time 1606    PT Stop Time 1644    PT Time Calculation (min) 38 min    Activity Tolerance Patient tolerated treatment well    Behavior During Therapy [facility] for tasks assessed/performed               Past Medical History:  Diagnosis Date   10 year risk of MI or stroke 7.5% or greater 09/02/2022   Cancer (HCC)    prostate cancer    Chronic kidney disease    Complication of anesthesia    anaphylaxis    Diabetes mellitus without complication (HCC)    GAD (generalized anxiety disorder) 02/13/2022   GERD (gastroesophageal reflux disease)    Gout    History of kidney stones    Hyperlipidemia    Hypertension    Microvascular ischemia of myocardium    Obesity    OSA (obstructive sleep apnea)    Prostate cancer (HCC)    Umbilical hernia without obstruction and without gangrene 12/02/2021   Past Surgical History:  Procedure Laterality Date   COLONOSCOPY WITH PROPOFOL  N/A 10/05/2016   Procedure: COLONOSCOPY WITH PROPOFOL ;  Surgeon: Deveron Fly, MD;  Location: [facility] ENDOSCOPY;  Service: Endoscopy;  Laterality: N/A;   COLONOSCOPY WITH PROPOFOL  N/A 10/04/2019   Procedure: COLONOSCOPY WITH PROPOFOL ;  Surgeon: Toledo, Alphonsus Jeans, MD;  Location: [facility] ENDOSCOPY;  Service: Gastroenterology;  Laterality: N/A;   CYST REMOVAL TRUNK  1977   ESOPHAGOGASTRODUODENOSCOPY (EGD) WITH PROPOFOL  N/A 10/04/2019   Procedure: ESOPHAGOGASTRODUODENOSCOPY (EGD) WITH PROPOFOL ;  Surgeon: Toledo, Alphonsus Jeans, MD;  Location: [facility] ENDOSCOPY;  Service: Gastroenterology;  Laterality: N/A;   LEFT HEART CATH AND CORONARY  ANGIOGRAPHY Left 05/17/2019   Procedure: LEFT HEART CATH AND CORONARY ANGIOGRAPHY;  Surgeon: Michelle Aid, MD;  Location: [facility] INVASIVE CV LAB;  Service: Cardiovascular;  Laterality: Left;   NECK SURGERY  2005   PROSTATE SURGERY     Patient Active Problem List   Diagnosis Date Noted   Personal history of gout 11/13/2020   History of prostate cancer 10/13/2019   Stage 3a chronic kidney disease (HCC) 06/05/2019   Microvascular ischemia of myocardium 06/01/2019   Angina pectoris (HCC) 05/10/2019   Encounter for general adult medical examination without abnormal findings 11/28/2018   Class 1 obesity due to excess calories with serious comorbidity and body mass index (BMI) of 34.0 to 34.9 in adult 07/26/2018   OSA on CPAP 11/30/2017   History of OCD (obsessive compulsive disorder) 06/01/2016   Mixed hypercholesterolemia and hypertriglyceridemia 04/20/2016   GERD without esophagitis 01/13/2016   Attention deficit disorder without hyperactivity 11/12/2015   Hypertension 05/14/2015   Diabetes mellitus type 2, uncontrolled, without complications 05/14/2015   Hypercalcemia 05/14/2015   Abdominal pain, LLQ (left lower quadrant) 04/23/2015   Male erectile dysfunction, unspecified 10/21/2012   Incomplete bladder emptying 10/21/2012   Malignant neoplasm of prostate (HCC) 10/21/2012   Testicular hypofunction 10/21/2012   Bladder outlet obstruction 10/21/2012    PCP: Monique Ano, MD  REFERRING PROVIDER: Agustina Aldrich, MD  REFERRING DIAG:  M54.12 (ICD-10-CM) - Radiculopathy, cervical region  THERAPY DIAG:  Cervicalgia  Abnormal posture  Muscle weakness (generalized)  Rationale for Evaluation and Treatment: Rehabilitation  ONSET DATE: march 2024  SUBJECTIVE:                                                                                                                                                                                                         PERTINENT HISTORY:  Pt  reports 2 months of R sided neck pain and N/T in his neck. 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 He reports being a R/L side sleeper. Reports a lot of cervical flexion when wood working causing his pain. Has seen a chiropractor for L sided neck pain and has since stopped. Does endorse R sided headaches and L sided as well in rams head distribution R > L. Has tingling from upper trap/mastoid to distal upper trap. No UE N/T however. Worst pain 8/10 NPS, best pain, 0/10 NPS. Averages 3/10 NPS. Pain currently 3/10 NPS. Pain described as tightness primarily, also dull and achey. Pain improved with rest, keeping neck in a neutral position, massaging his neck muscles. Heat/ice helps.   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 He reports numbness and feeling like a nerve shooting to the right upper trap when looking down and turning to the left. He is unsure if the dry needling helped but it did not make things worse.   PAIN:  NPRS scale: current 5-6/10 mostly over right UT region Pain location: R cervical paraspinals, upper trapezius.  Pain description: dull/achey/tingling Aggravating factors: cervical flexion, heavy lifting, sleep Relieving factors: rest/ice/heat  PRECAUTIONS: None  PATIENT GOALS: improve his pain and get it controlled  NEXT MD VISIT: N/A  OBJECTIVE:   TREATMENT  Therapeutic exercise: therapeutic exercises that incorporate ONE parameter at one or more areas of the body to centralize symptoms, develop strength and endurance, range of motion, and flexibility required for successful completion of  functional activities.  NuStep using bilateral upper and lower extremities. For improved extremity mobility, muscular endurance, and activity tolerance; and to induce the analgesic effect of aerobic exercise, stimulate improved joint nutrition, and prepare body structures and systems for following interventions. Also to reinforce understanding of appropriate exercise intensity to help meet physical activity guidelines for health.  Seat/handle setting: 13/14 6  minutes Level: 5 Target SPM: > 90 Average SPM: 92 RPE: 6/10  Seated cervical SNAGS Rotational: 1x10 each direction at upper and 1/2 inch lower than that with edge of pillow case  Seated thoracic extension over back of chair with hands clasped around back of neck and forearms as close to under chin as possible.  2x15  Education on interventions and update to HEP  Manual therapy: to reduce pain and tissue tension, improve range of motion, neuromodulation, in order to promote improved ability to complete functional activities. PRONE STM to bilateral suboccipitals, cervical paraspinals, and UT CPA grade III-IV at C7, 1x30 seconds UPA grade III-IV at C2-C4, 1x30 seconds each level each side  Trigger Point Dry Needling  Subsequent Treatment: Instructions provided previously at initial dry needling treatment.  Instructions reviewed, if requested by the patient, prior to subsequent dry needling treatment.   Patient Verbal Consent Given: Yes Education Handout Provided: Previously Provided Muscles Treated: 3 dry needle(s) .30mm x 60mm inserted with 1 stick each side into suboccipitals and 3 sticks into right UT muscles with patient in prone position to decrease pain and spasms along patient's neck and head region. Electrical Stimulation Performed: No Treatment Response/Outcome: Patient with improved rotation bilaterally and into flexion following dry needling and manual therapy. Patient with intense twitch response at right  suboccipitals and UT. No unexpected or abnormal response noted.    PATIENT EDUCATION:  Education details: Exercise purpose/form. Self management techniques. Education on diagnosis, prognosis, POC, anatomy and physiology of current condition. Education on HEP including handout. Person educated: Patient Education method: Explanation, Demonstration, and Handouts Education comprehension: verbalized understanding and needs further education  HOME EXERCISE PROGRAM: Access Code: [email or web address] URL: [email or web address] Date: 07/28/2023 Prepared by: Lyda Samples  Exercises - Supine Chin Tuck  - 1 x daily - 7 x weekly - 2 sets - 12 reps - Seated Scapular Retraction  - 1 x daily - 7 x weekly - 2 sets - 12 reps - Seated Thoracic Lumbar Extension  - 1 x daily - 7 x weekly - 2 sets - 12 reps - Seated Assisted Cervical Rotation with Towel  - 1 x daily - 7 x weekly - 1 sets - 12 reps - Cervical Extension AROM with Strap  - 1 x daily - 7 x weekly - 1 sets - 12 reps  ASSESSMENT:  CLINICAL IMPRESSION: Patient with minimal improvements in symptoms when he arrives to PT today but wanting to repeat dry needling. Also included joint mobilizations to the cervical spine today with improved motion following. Updated HEP to include more targeted thoracic extension. Patient tolerated session well overall with improved motion by end of session. Patient would benefit from continued management of limiting condition by skilled physical therapist to address remaining impairments and functional limitations to work towards stated goals and return to PLOF or maximal functional independence.    OBJECTIVE IMPAIRMENTS: decreased mobility, decreased ROM, decreased strength, hypomobility, improper body  mechanics, postural dysfunction, and pain.   ACTIVITY LIMITATIONS: carrying, lifting, standing, and sleeping  PARTICIPATION LIMITATIONS: occupation and carpentry  PERSONAL FACTORS: Age, Behavior pattern, Fitness,  Past/current experiences, and Time since onset of injury/illness/exacerbation are also affecting patient's functional outcome.   REHAB POTENTIAL: Good  CLINICAL DECISION MAKING: Stable/uncomplicated  EVALUATION COMPLEXITY: Low   GOALS: Goals reviewed with patient? No  SHORT TERM GOALS: Target date: 08/23/23  Pt will be independent with HEP to improve cervical/thoracic ROM and pain with ADL's. Baseline: 07/26/23: provided HEP Goal status: In progress  LONG TERM GOALS: Target date: 09/20/23  Pt will improve NDI by at least 8 points to demo clinically significant improvement with disability from cervicalgia.  Baseline: 07/26/23: 10/50 Goal status: In progress  2.  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  Baseline: 07/26/23: worst: 8/10 NPS.  Goal status: In progress  3.  Pt will report no neck pain with 6-8 hours of sleep in side lying to improve QoL.  Baseline: 07/26/23: 3/10 NPS average with sleep. Goal status: In progress  4.  Pt will improve cervical AROM to WNL in all planes to demonstrate improved cervical AROM and pain with ADL's.  Baseline: 07/26/23:   Active ROM A/PROM (deg) eval  Flexion 50*  Extension 45  Right lateral flexion 35*  Left lateral flexion 40  Right rotation 55  Left rotation 70   Goal status: In progress PLAN:  PT FREQUENCY: 1-2x/week  PT DURATION: 8 weeks  PLANNED INTERVENTIONS: 97164- PT Re-evaluation, 97110-Therapeutic exercises, 97530- Therapeutic activity, [id number]- Neuromuscular re-education, 97535- Self Care, 09604- Manual therapy, G0283- Electrical stimulation (unattended), [phone]- Electrical stimulation (manual), Patient/Family education, Dry Needling, Joint mobilization, Joint manipulation, Spinal manipulation, Spinal mobilization, Cryotherapy, and Moist heat  PLAN FOR NEXT SESSION: Progress HEP. Cervical and thoracic mobility, postural  strengthening.    Carilyn Charles. Artemio Larry, PT, DPT 08/04/23, 8:00 PM  [facility] [address] [place], [place] [address] P: [phone] I F: [phone]

## 2023-08-05 ENCOUNTER — Encounter: Payer: Self-pay | Admitting: Physical Therapy

## 2023-08-11 ENCOUNTER — Telehealth: Payer: Self-pay | Admitting: Physical Therapy

## 2023-08-11 ENCOUNTER — Encounter: Admitting: Physical Therapy

## 2023-08-11 NOTE — Telephone Encounter (Signed)
 LVM notifying patient of missed PT visit scheduled today. Let patient know that with any no-show I am required to review our cancellation policy that after 2 no-shows we remove future visits from the schedule, they are responsible to calling in to reschedule.   While on the phone patient called back and spoke to support staff stating he forgot his appointment today but rescheduled to tomorrow at 6:15pm.   Carilyn Charles. Artemio Larry, PT, DPT 08/11/23, 4:16 PM  [facility] [address] [place], [place] [address] P: [phone] I F: [phone]

## 2023-08-12 ENCOUNTER — Encounter: Payer: Self-pay | Admitting: Physical Therapy

## 2023-08-12 ENCOUNTER — Ambulatory Visit: Admitting: Physical Therapy

## 2023-08-12 DIAGNOSIS — R293 Abnormal posture: Secondary | ICD-10-CM

## 2023-08-12 DIAGNOSIS — M542 Cervicalgia: Secondary | ICD-10-CM | POA: Diagnosis not present

## 2023-08-12 DIAGNOSIS — M6281 Muscle weakness (generalized): Secondary | ICD-10-CM

## 2023-08-12 NOTE — Therapy (Signed)
 OUTPATIENT PHYSICAL THERAPY TREATMENT   Patient Name: Scott Skinner MRN: [id number] DOB:11-Jul-1950, 73 y.o., male Today's Date: 08/12/2023  END OF SESSION:  PT End of Session - 08/12/23 1822     Visit Number 5    Number of Visits 17    Date for PT Re-Evaluation 09/20/23    Authorization Type MEDICARE PART B reporting period from 07/26/2023    Progress Note Due on Visit 10    PT Start Time 1822    PT Stop Time 1908    PT Time Calculation (min) 46 min    Activity Tolerance Patient tolerated treatment well    Behavior During Therapy [facility] for tasks assessed/performed             Past Medical History:  Diagnosis Date   10 year risk of MI or stroke 7.5% or greater 09/02/2022   Cancer (HCC)    prostate cancer    Chronic kidney disease    Complication of anesthesia    anaphylaxis    Diabetes mellitus without complication (HCC)    GAD (generalized anxiety disorder) 02/13/2022   GERD (gastroesophageal reflux disease)    Gout    History of kidney stones    Hyperlipidemia    Hypertension    Microvascular ischemia of myocardium    Obesity    OSA (obstructive sleep apnea)    Prostate cancer (HCC)    Umbilical hernia without obstruction and without gangrene 12/02/2021   Past Surgical History:  Procedure Laterality Date   COLONOSCOPY WITH PROPOFOL  N/A 10/05/2016   Procedure: COLONOSCOPY WITH PROPOFOL ;  Surgeon: Deveron Fly, MD;  Location: [facility] ENDOSCOPY;  Service: Endoscopy;  Laterality: N/A;   COLONOSCOPY WITH PROPOFOL  N/A 10/04/2019   Procedure: COLONOSCOPY WITH PROPOFOL ;  Surgeon: Toledo, Alphonsus Jeans, MD;  Location: [facility] ENDOSCOPY;  Service: Gastroenterology;  Laterality: N/A;   CYST REMOVAL TRUNK  1977   ESOPHAGOGASTRODUODENOSCOPY (EGD) WITH PROPOFOL  N/A 10/04/2019   Procedure: ESOPHAGOGASTRODUODENOSCOPY (EGD) WITH PROPOFOL ;  Surgeon: Toledo, Alphonsus Jeans, MD;  Location: [facility] ENDOSCOPY;  Service: Gastroenterology;  Laterality: N/A;   LEFT HEART CATH AND CORONARY  ANGIOGRAPHY Left 05/17/2019   Procedure: LEFT HEART CATH AND CORONARY ANGIOGRAPHY;  Surgeon: Michelle Aid, MD;  Location: [facility] INVASIVE CV LAB;  Service: Cardiovascular;  Laterality: Left;   NECK SURGERY  2005   PROSTATE SURGERY     Patient Active Problem List   Diagnosis Date Noted   Personal history of gout 11/13/2020   History of prostate cancer 10/13/2019   Stage 3a chronic kidney disease (HCC) 06/05/2019   Microvascular ischemia of myocardium 06/01/2019   Angina pectoris (HCC) 05/10/2019   Encounter for general adult medical examination without abnormal findings 11/28/2018   Class 1 obesity due to excess calories with serious comorbidity and body mass index (BMI) of 34.0 to 34.9 in adult 07/26/2018   OSA on CPAP 11/30/2017   History of OCD (obsessive compulsive disorder) 06/01/2016   Mixed hypercholesterolemia and hypertriglyceridemia 04/20/2016   GERD without esophagitis 01/13/2016   Attention deficit disorder without hyperactivity 11/12/2015   Hypertension 05/14/2015   Diabetes mellitus type 2, uncontrolled, without complications 05/14/2015   Hypercalcemia 05/14/2015   Abdominal pain, LLQ (left lower quadrant) 04/23/2015   Male erectile dysfunction, unspecified 10/21/2012   Incomplete bladder emptying 10/21/2012   Malignant neoplasm of prostate (HCC) 10/21/2012   Testicular hypofunction 10/21/2012   Bladder outlet obstruction 10/21/2012    PCP: Monique Ano, MD  REFERRING PROVIDER: Agustina Aldrich, MD  REFERRING DIAG: [phone] (ICD-10-CM) -  Radiculopathy, cervical region  THERAPY DIAG:  Cervicalgia  Abnormal posture  Muscle weakness (generalized)  Rationale for Evaluation and Treatment: Rehabilitation  ONSET DATE: march 2024  SUBJECTIVE:                                                                                                                                                                                                         PERTINENT HISTORY:  Pt  reports 2 months of R sided neck pain and N/T in his neck. 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 He reports being a R/L side sleeper. Reports a lot of cervical flexion when wood working causing his pain. Has seen a chiropractor for L sided neck pain and has since stopped. Does endorse R sided headaches and L sided as well in rams head distribution R > L. Has tingling from upper trap/mastoid to distal upper trap. No UE N/T however. Worst pain 8/10 NPS, best pain, 0/10 NPS. Averages 3/10 NPS. Pain currently 3/10 NPS. Pain described as tightness primarily, also dull and achey. Pain improved with rest, keeping neck in a neutral position, massaging his neck muscles. Heat/ice helps.   RED FLAGS: Cervical red flags: Dysphagia No, Dysmetria No, Diplopia No, Nystagmus No, and Nausea No, Bowel or bladder incontinence: No, and Cauda equina syndrome: No     SUBJECTIVE STATEMENT: Patient states his neck is hurting today. He states his neck ROM has improved. 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 He is questioning what PT can do if it is arthritis pinching a nerve, since the relief continues to be temporary.   PAIN:  NPRS scale: current 3/10 mostly over right UT region Pain location: R cervical paraspinals, upper trapezius.  Pain description: dull/achey/tingling Aggravating factors: cervical flexion, heavy lifting, sleep Relieving factors: rest/ice/heat  PRECAUTIONS: None  PATIENT GOALS: improve his pain and get it controlled  NEXT MD VISIT: N/A  OBJECTIVE:   TREATMENT  Therapeutic exercise: therapeutic exercises that incorporate ONE parameter at one or more areas of the body to centralize symptoms, develop strength and  endurance, range of motion, and flexibility required for successful completion of functional activities.  Education on condition and how PT fits into treatment.   Seated cervical retraction 1x10 AROM 1x10 with self overpressure (OP) 1x6 with clinician OP - manual therapy Better 1x10 self OP Better  1x10 with extension Increased soreness 1x6 with clinician OP - manual therapy Better  Prone on elbows cervical retraction with self OP 1x10  Improved feeling of pressure like PT applied Not better than seated with clinician OP  Seated posture trial with education on how to use lumbar roll and stay out of neck flexion.   Education on HEP including handout    Manual therapy: to reduce pain and tissue tension, improve range of motion, neuromodulation, in order to promote improved ability to complete functional activities. PRONE STM to bilateral suboccipitals, cervical paraspinals, and UT UPA grade III-IV at C2-C6, 1x30 seconds each level each side  Sets of cervical retraction with clinician OP (see above)   PATIENT EDUCATION:  Education details: Exercise purpose/form. Self management techniques. Education on diagnosis, prognosis, POC, anatomy and physiology of current condition. Education on HEP including handout. Person educated: Patient Education method: Explanation, Demonstration, and Handouts Education comprehension: verbalized understanding and needs further education  HOME EXERCISE PROGRAM: Access Code: [email or web address] URL: [email or web address] Date: 08/12/2023 Prepared by: Alleen Isle  Exercises - Seated Assisted Cervical Rotation with Towel  - 1 x daily - 7 x weekly - 1 sets - 12 reps - Seated Correct Posture   HOME EXERCISE PROGRAM [[email or web address]] View at [email or web address] code [facility] Cervical Retraction in Sitting w/OP - MDT -  Repeat 20 Repetitions, Hold 1 Second(s), Complete 2 Sets, Perform 5 Times a Day  Retraction in Prone with Overpressure- McKenzie -   Repeat 20 Repetitions, Hold 1 Second(s), Complete 2 Sets, Perform 5 Times a Day  ASSESSMENT:  CLINICAL IMPRESSION: Trial of MDT approach this session after patient reports pattern of pain with increased nerve symptoms and pain with cervical spine flexion and better with extension, and this was reproduced when positioned in prone. Patient appeared to respond best with decreased concordant symptoms with cervical retraction with overpressure (Best response with clinician OP). HEP was updated for trial of repeated retraction with overpressure throughout the day. Plan to re-assess and adjust approach and HEP as appropriate next session. Patient would benefit from continued management of limiting condition by skilled physical therapist to address remaining impairments and functional limitations to work towards stated goals and return to PLOF or maximal functional independence.   OBJECTIVE IMPAIRMENTS: decreased mobility, decreased ROM, decreased strength, hypomobility, improper body mechanics, postural dysfunction, and pain.   ACTIVITY LIMITATIONS: carrying, lifting, standing, and sleeping  PARTICIPATION LIMITATIONS: occupation and carpentry  PERSONAL FACTORS: Age, Behavior pattern, Fitness, Past/current experiences, and Time since onset of injury/illness/exacerbation are also affecting patient's functional outcome.   REHAB POTENTIAL: Good  CLINICAL DECISION MAKING: Stable/uncomplicated  EVALUATION COMPLEXITY: Low   GOALS: Goals reviewed with patient? No  SHORT TERM GOALS: Target date: 08/23/23  Pt will be independent with HEP to improve cervical/thoracic ROM and pain with ADL's. Baseline: 07/26/23: provided HEP Goal status: In progress  LONG TERM GOALS: Target date: 09/20/23  Pt will improve NDI by at least 8 points to demo clinically significant improvement with disability from cervicalgia.  Baseline: 07/26/23: 10/50 Goal status: In progress  2.  Pt will report worst pain  as a 2/10 NPS or  less with carpentry to demo clinically significant improvement in pain with recreational activity.  Baseline: 07/26/23: worst: 8/10 NPS.  Goal status: In progress  3.  Pt will report no neck pain with 6-8 hours of sleep in side lying to improve QoL.  Baseline: 07/26/23: 3/10 NPS average with sleep. Goal status: In progress  4.  Pt will improve cervical AROM to WNL in all planes to demonstrate improved cervical AROM and pain with ADL's.  Baseline: 07/26/23:   Active ROM A/PROM (deg) eval  Flexion 50*  Extension 45  Right lateral flexion 35*  Left lateral flexion 40  Right rotation 55  Left rotation 70   Goal status: In progress PLAN:  PT FREQUENCY: 1-2x/week  PT DURATION: 8 weeks  PLANNED INTERVENTIONS: 97164- PT Re-evaluation, 97110-Therapeutic exercises, 97530- Therapeutic activity, [id number]- Neuromuscular re-education, 97535- Self Care, 04540- Manual therapy, G0283- Electrical stimulation (unattended), [phone]- Electrical stimulation (manual), Patient/Family education, Dry Needling, Joint mobilization, Joint manipulation, Spinal manipulation, Spinal mobilization, Cryotherapy, and Moist heat  PLAN FOR NEXT SESSION: Progress HEP. Cervical and thoracic mobility, postural strengthening.    Carilyn Charles. Artemio Larry, PT, DPT 08/12/23, 7:17 PM  [facility] [address] [place], [place] [address] P: [phone] I F: [phone]

## 2023-08-18 ENCOUNTER — Ambulatory Visit: Attending: Neurosurgery | Admitting: Physical Therapy

## 2023-08-18 DIAGNOSIS — M6281 Muscle weakness (generalized): Secondary | ICD-10-CM | POA: Diagnosis present

## 2023-08-18 DIAGNOSIS — M542 Cervicalgia: Secondary | ICD-10-CM | POA: Diagnosis present

## 2023-08-18 DIAGNOSIS — R293 Abnormal posture: Secondary | ICD-10-CM | POA: Diagnosis present

## 2023-08-18 NOTE — Therapy (Signed)
 OUTPATIENT PHYSICAL THERAPY TREATMENT   Patient Name: Scott Skinner MRN: [id number] DOB:1950-09-27, 73 y.o., male Today's Date: 08/18/2023  END OF SESSION:  PT End of Session - 08/18/23 1038     Visit Number 6    Number of Visits 17    Date for PT Re-Evaluation 09/20/23    Authorization Type MEDICARE PART B reporting period from 07/26/2023    Progress Note Due on Visit 10    PT Start Time 1032    PT Stop Time 1110    PT Time Calculation (min) 38 min    Activity Tolerance Patient tolerated treatment well    Behavior During Therapy [facility] for tasks assessed/performed              Past Medical History:  Diagnosis Date   10 year risk of MI or stroke 7.5% or greater 09/02/2022   Cancer (HCC)    prostate cancer    Chronic kidney disease    Complication of anesthesia    anaphylaxis    Diabetes mellitus without complication (HCC)    GAD (generalized anxiety disorder) 02/13/2022   GERD (gastroesophageal reflux disease)    Gout    History of kidney stones    Hyperlipidemia    Hypertension    Microvascular ischemia of myocardium    Obesity    OSA (obstructive sleep apnea)    Prostate cancer (HCC)    Umbilical hernia without obstruction and without gangrene 12/02/2021   Past Surgical History:  Procedure Laterality Date   COLONOSCOPY WITH PROPOFOL  N/A 10/05/2016   Procedure: COLONOSCOPY WITH PROPOFOL ;  Surgeon: Deveron Fly, MD;  Location: [facility] ENDOSCOPY;  Service: Endoscopy;  Laterality: N/A;   COLONOSCOPY WITH PROPOFOL  N/A 10/04/2019   Procedure: COLONOSCOPY WITH PROPOFOL ;  Surgeon: Toledo, Alphonsus Jeans, MD;  Location: [facility] ENDOSCOPY;  Service: Gastroenterology;  Laterality: N/A;   CYST REMOVAL TRUNK  1977   ESOPHAGOGASTRODUODENOSCOPY (EGD) WITH PROPOFOL  N/A 10/04/2019   Procedure: ESOPHAGOGASTRODUODENOSCOPY (EGD) WITH PROPOFOL ;  Surgeon: Toledo, Alphonsus Jeans, MD;  Location: [facility] ENDOSCOPY;  Service: Gastroenterology;  Laterality: N/A;   LEFT HEART CATH AND CORONARY  ANGIOGRAPHY Left 05/17/2019   Procedure: LEFT HEART CATH AND CORONARY ANGIOGRAPHY;  Surgeon: Michelle Aid, MD;  Location: [facility] INVASIVE CV LAB;  Service: Cardiovascular;  Laterality: Left;   NECK SURGERY  2005   PROSTATE SURGERY     Patient Active Problem List   Diagnosis Date Noted   Personal history of gout 11/13/2020   History of prostate cancer 10/13/2019   Stage 3a chronic kidney disease (HCC) 06/05/2019   Microvascular ischemia of myocardium 06/01/2019   Angina pectoris (HCC) 05/10/2019   Encounter for general adult medical examination without abnormal findings 11/28/2018   Class 1 obesity due to excess calories with serious comorbidity and body mass index (BMI) of 34.0 to 34.9 in adult 07/26/2018   OSA on CPAP 11/30/2017   History of OCD (obsessive compulsive disorder) 06/01/2016   Mixed hypercholesterolemia and hypertriglyceridemia 04/20/2016   GERD without esophagitis 01/13/2016   Attention deficit disorder without hyperactivity 11/12/2015   Hypertension 05/14/2015   Diabetes mellitus type 2, uncontrolled, without complications 05/14/2015   Hypercalcemia 05/14/2015   Abdominal pain, LLQ (left lower quadrant) 04/23/2015   Male erectile dysfunction, unspecified 10/21/2012   Incomplete bladder emptying 10/21/2012   Malignant neoplasm of prostate (HCC) 10/21/2012   Testicular hypofunction 10/21/2012   Bladder outlet obstruction 10/21/2012    PCP: Monique Ano, MD  REFERRING PROVIDER: Agustina Aldrich, MD  REFERRING DIAG: [phone] (  ICD-10-CM) - Radiculopathy, cervical region  THERAPY DIAG:  Cervicalgia  Abnormal posture  Muscle weakness (generalized)  Rationale for Evaluation and Treatment: Rehabilitation  ONSET DATE: march 2024  SUBJECTIVE:                                                                                                                                                                                                         PERTINENT HISTORY:  Pt  reports 2 months of R sided neck pain and N/T in his neck. 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 He reports being a R/L side sleeper. Reports a lot of cervical flexion when wood working causing his pain. Has seen a chiropractor for L sided neck pain and has since stopped. Does endorse R sided headaches and L sided as well in rams head distribution R > L. Has tingling from upper trap/mastoid to distal upper trap. No UE N/T however. Worst pain 8/10 NPS, best pain, 0/10 NPS. Averages 3/10 NPS. Pain currently 3/10 NPS. Pain described as tightness primarily, also dull and achey. Pain improved with rest, keeping neck in a neutral position, massaging his neck muscles. Heat/ice helps.   RED FLAGS: Cervical red flags: Dysphagia No, Dysmetria No, Diplopia No, Nystagmus No, and Nausea No, Bowel or bladder incontinence: No, and Cauda equina syndrome: No     SUBJECTIVE STATEMENT: Patient states he is sleepy. He states his neck is the same. He did not get his HEP every 2 hours. 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   PAIN:  NPRS scale: tightness only  over right UT region, tingling if he flexes his neck Pain location: R cervical paraspinals, upper trapezius.  Pain description: dull/achey/tingling Aggravating factors: cervical flexion, heavy lifting, sleep Relieving factors: rest/ice/heat  PRECAUTIONS: None  PATIENT GOALS: improve his pain and get it controlled  NEXT MD VISIT: N/A  OBJECTIVE:   TREATMENT  Therapeutic exercise: therapeutic exercises that incorporate ONE parameter at one or more areas of the body to centralize symptoms, develop strength and endurance, range of motion, and flexibility required for successful completion of functional activities.  Seated cervical retraction 1x15 with self overpressure  (OP)  Seated thoracic extension with hands interlaced behind neck 1x15 dizzy  Seated with elbows on elevated plinth cervical retraction with self OP 2x15 Cuing to keep neck in neutral and not extended position and push at a 90 degree angle to plane of spine Good carry over 2nd set Makes him sore at CT junction Looser neck flexion following each set  Seated cervical retraction against small blue ball 3x10 Towel roll at lumbar spine   Sidelying open book (thoracic rotation) to improve thoracic, shoulder girdle, and upper trunk mobility.  2x10 each side Good carry over after instruction  Standing single arm pec stretch at wall,  2x60 seconds each side with shoulder abducted over 90 degrees  Inclined B AROM scaption prone on [employer] 3x10 Gentle cervical spine retraction  Standing B shoulder ER with scapular retraction while holding neck in gentle retraction against blue ball against wall 3x15 with GTB  Pt required multimodal cuing for proper technique and to facilitate improved neuromuscular control, strength, range of motion, and functional ability resulting in improved performance and form.   PATIENT EDUCATION:  Education details: Exercise purpose/form. Self management techniques.  Person educated: Patient Education method: Explanation, Demonstration, and Handouts Education comprehension: verbalized understanding and needs further education  HOME EXERCISE PROGRAM: Access Code: [email or web address] URL: [email or web address] Date: 08/12/2023 Prepared by: Alleen Isle  Exercises - Seated Assisted Cervical Rotation with Towel  - 1 x daily - 7 x weekly - 1 sets - 12 reps - Seated Correct Posture   HOME EXERCISE PROGRAM [NWQTDZH] View at [email or web address] code [facility] Cervical Retraction in Sitting w/OP - MDT -  Repeat 20 Repetitions, Hold 1 Second(s), Complete 2 Sets, Perform 5 Times a Day  Retraction in Prone with Overpressure- McKenzie -  Repeat 20 Repetitions,  Hold 1 Second(s), Complete 2 Sets, Perform 5 Times a Day  ASSESSMENT:  CLINICAL IMPRESSION: Patient continues to have good response to repeated retraction with overpressure with elbows propped up but he needed cuing to perform more effectively at home. Continued with other exercises to improve motion and postural strength. Patient would benefit from continued management of limiting condition by skilled physical therapist to address remaining impairments and functional limitations to work towards stated goals and return to PLOF or maximal functional independence.    OBJECTIVE IMPAIRMENTS: decreased mobility, decreased ROM, decreased strength, hypomobility, improper body mechanics, postural dysfunction, and pain.   ACTIVITY LIMITATIONS: carrying, lifting, standing, and sleeping  PARTICIPATION LIMITATIONS: occupation and carpentry  PERSONAL FACTORS: Age, Behavior pattern, Fitness, Past/current experiences, and Time since onset of injury/illness/exacerbation are also affecting patient's functional outcome.   REHAB POTENTIAL: Good  CLINICAL DECISION MAKING: Stable/uncomplicated  EVALUATION COMPLEXITY: Low   GOALS: Goals reviewed with patient? No  SHORT TERM GOALS: Target date: 08/23/23  Pt will be independent with HEP to improve cervical/thoracic ROM and pain with ADL's. Baseline: 07/26/23: provided HEP Goal status: In progress  LONG TERM GOALS: Target date: 09/20/23  Pt will improve NDI by at least 8 points to demo clinically significant improvement with disability from cervicalgia.  Baseline: 07/26/23: 10/50 20% Goal status: In progress  2.  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  Baseline: 07/26/23: worst: 8/10 NPS.  Goal status: In progress  3.  Pt will report no neck pain with 6-8 hours of sleep in side lying to improve QoL.  Baseline: 07/26/23: 3/10 NPS average with sleep. Goal  status: In progress  4.  Pt will improve cervical AROM to WNL in all planes to demonstrate improved cervical AROM and pain with ADL's.  Baseline: 07/26/23:   Active ROM A/PROM (deg) eval  Flexion 50*  Extension 45  Right lateral flexion 35*  Left lateral flexion 40  Right rotation 55  Left rotation 70   Goal status: In progress PLAN:  PT FREQUENCY: 1-2x/week  PT DURATION: 8 weeks  PLANNED INTERVENTIONS: 97164- PT Re-evaluation, 97110-Therapeutic exercises, 97530- Therapeutic activity, [id number]- Neuromuscular re-education, 97535- Self Care, 16109- Manual therapy, G0283- Electrical stimulation (unattended), [phone]- Electrical stimulation (manual), Patient/Family education, Dry Needling, Joint mobilization, Joint manipulation, Spinal manipulation, Spinal mobilization, Cryotherapy, and Moist heat  PLAN FOR NEXT SESSION: Progress HEP as appropriate. MDT cervical retraction with overpressure (decreases sympots). Cervical and thoracic mobility, postural strengthening.    Carilyn Charles. Artemio Larry, PT, DPT 08/18/23, 11:12 AM  [facility] Rehab [address] [place], [place] [address] P: [phone] I F: [phone]

## 2023-08-23 ENCOUNTER — Ambulatory Visit: Admitting: Physical Therapy

## 2023-08-23 ENCOUNTER — Encounter: Payer: Self-pay | Admitting: Physical Therapy

## 2023-08-23 DIAGNOSIS — M542 Cervicalgia: Secondary | ICD-10-CM | POA: Diagnosis not present

## 2023-08-23 DIAGNOSIS — M6281 Muscle weakness (generalized): Secondary | ICD-10-CM

## 2023-08-23 DIAGNOSIS — R293 Abnormal posture: Secondary | ICD-10-CM

## 2023-08-23 NOTE — Therapy (Signed)
 OUTPATIENT PHYSICAL THERAPY TREATMENT   Patient Name: Scott Skinner MRN: [id number] DOB:[birth year], 73 y.o., male Today's Date: 08/23/2023  END OF SESSION:  PT End of Session - 08/23/23 0954     Visit Number 7    Number of Visits 17    Date for PT Re-Evaluation 09/20/23    Authorization Type MEDICARE PART B reporting period from 07/26/2023    Progress Note Due on Visit 10    PT Start Time 0948    PT Stop Time 1031    PT Time Calculation (min) 43 min    Activity Tolerance Patient tolerated treatment well    Behavior During Therapy [facility] for tasks assessed/performed               Past Medical History:  Diagnosis Date   10 year risk of MI or stroke 7.5% or greater 09/02/2022   Cancer (HCC)    prostate cancer    Chronic kidney disease    Complication of anesthesia    anaphylaxis    Diabetes mellitus without complication (HCC)    GAD (generalized anxiety disorder) 02/13/2022   GERD (gastroesophageal reflux disease)    Gout    History of kidney stones    Hyperlipidemia    Hypertension    Microvascular ischemia of myocardium    Obesity    OSA (obstructive sleep apnea)    Prostate cancer (HCC)    Umbilical hernia without obstruction and without gangrene 12/02/2021   Past Surgical History:  Procedure Laterality Date   COLONOSCOPY WITH PROPOFOL  N/A 10/05/2016   Procedure: COLONOSCOPY WITH PROPOFOL ;  Surgeon: Deveron Fly, MD;  Location: [facility] ENDOSCOPY;  Service: Endoscopy;  Laterality: N/A;   COLONOSCOPY WITH PROPOFOL  N/A 10/04/2019   Procedure: COLONOSCOPY WITH PROPOFOL ;  Surgeon: Toledo, Alphonsus Jeans, MD;  Location: [facility] ENDOSCOPY;  Service: Gastroenterology;  Laterality: N/A;   CYST REMOVAL TRUNK  1977   ESOPHAGOGASTRODUODENOSCOPY (EGD) WITH PROPOFOL  N/A 10/04/2019   Procedure: ESOPHAGOGASTRODUODENOSCOPY (EGD) WITH PROPOFOL ;  Surgeon: Toledo, Alphonsus Jeans, MD;  Location: [facility] ENDOSCOPY;  Service: Gastroenterology;  Laterality: N/A;   LEFT HEART CATH AND CORONARY  ANGIOGRAPHY Left 05/17/2019   Procedure: LEFT HEART CATH AND CORONARY ANGIOGRAPHY;  Surgeon: Michelle Aid, MD;  Location: [facility] INVASIVE CV LAB;  Service: Cardiovascular;  Laterality: Left;   NECK SURGERY  2005   PROSTATE SURGERY     Patient Active Problem List   Diagnosis Date Noted   Personal history of gout 11/13/2020   History of prostate cancer 10/13/2019   Stage 3a chronic kidney disease (HCC) 06/05/2019   Microvascular ischemia of myocardium 06/01/2019   Angina pectoris (HCC) 05/10/2019   Encounter for general adult medical examination without abnormal findings 11/28/2018   Class 1 obesity due to excess calories with serious comorbidity and body mass index (BMI) of 34.0 to 34.9 in adult 07/26/2018   OSA on CPAP 11/30/2017   History of OCD (obsessive compulsive disorder) 06/01/2016   Mixed hypercholesterolemia and hypertriglyceridemia 04/20/2016   GERD without esophagitis 01/13/2016   Attention deficit disorder without hyperactivity 11/12/2015   Hypertension 05/14/2015   Diabetes mellitus type 2, uncontrolled, without complications 05/14/2015   Hypercalcemia 05/14/2015   Abdominal pain, LLQ (left lower quadrant) 04/23/2015   Male erectile dysfunction, unspecified 10/21/2012   Incomplete bladder emptying 10/21/2012   Malignant neoplasm of prostate (HCC) 10/21/2012   Testicular hypofunction 10/21/2012   Bladder outlet obstruction 10/21/2012    PCP: Monique Ano, MD  REFERRING PROVIDER: Agustina Aldrich, MD  REFERRING DIAG:  M54.12 (ICD-10-CM) - Radiculopathy, cervical region  THERAPY DIAG:  Cervicalgia  Abnormal posture  Muscle weakness (generalized)  Rationale for Evaluation and Treatment: Rehabilitation  ONSET DATE: march 2024  SUBJECTIVE:                                                                                                                                                                                                         PERTINENT HISTORY:  Pt  reports 2 months of R sided neck pain and N/T in his neck. 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 He reports being a R/L side sleeper. Reports a lot of cervical flexion when wood working causing his pain. Has seen a chiropractor for L sided neck pain and has since stopped. Does endorse R sided headaches and L sided as well in rams head distribution R > L. Has tingling from upper trap/mastoid to distal upper trap. No UE N/T however. Worst pain 8/10 NPS, best pain, 0/10 NPS. Averages 3/10 NPS. Pain currently 3/10 NPS. Pain described as tightness primarily, also dull and achey. Pain improved with rest, keeping neck in a neutral position, massaging his neck muscles. Heat/ice helps.   RED FLAGS: Cervical red flags: Dysphagia No, Dysmetria No, Diplopia No, Nystagmus No, and Nausea No, Bowel or bladder incontinence: No, and Cauda equina syndrome: No     SUBJECTIVE STATEMENT: Patient states his neck is about the same. He has been doing his HEP. He still gets the tinging to the right ear when he bends his head forwards.   PAIN:  NPRS scale: little tightness over the right cervical spine.  Pain location: R cervical paraspinals, upper trapezius.  Pain description: dull/achey/tingling Aggravating factors: cervical flexion, heavy lifting, sleep Relieving factors: rest/ice/heat  PRECAUTIONS: None  PATIENT GOALS: improve his pain and get it controlled  NEXT MD VISIT: N/A  OBJECTIVE:   TREATMENT  Therapeutic exercise: therapeutic exercises that incorporate ONE parameter at one or more areas of the body to centralize symptoms, develop strength and endurance, range of motion, and flexibility required for successful completion of functional activities.  Seated with elbows on elevated plinth cervical retraction with self OP 3x15 Improved carry over  Standing spinal  rotation with mini lunge with hip against wall, sliding ipsilateral arm on wall overhead to  twisted position and back.  3x10 each side Added to HEP  Seated Suboccipital Stretch and C1-C2 Mobilization (retraction, upper C-spine flexion, slight sidebend and CL rotation).  1x20 each direction with 2 second holds Pain/stiff with rotation to R   Seated R rotation with self overpressure (MDT positioning) 2x10 R side only  Seated flexion rotation with self overpressure  R: 1x5 setting concordant symptoms "on fire" L: 1x5 still worsening symptoms but not as much.   Prone cervical retraction with self overpressure 2x10 Feels better "nerve is not being pinched"   Standing thoracic extension with hands clasped behind neck and elbows on wall 3x10 Strongly felt in base of neck and upper thoracic spine.  Added to HEP   Pt required multimodal cuing for proper technique and to facilitate improved neuromuscular control, strength, range of motion, and functional ability resulting in improved performance and form.   PATIENT EDUCATION:  Education details: Exercise purpose/form. Self management techniques.  Person educated: Patient Education method: Explanation, Demonstration, and Handouts Education comprehension: verbalized understanding and needs further education  HOME EXERCISE PROGRAM: Access Code: [email or web address] URL: [email or web address] Date: 08/23/2023 Prepared by: Alleen Isle  Exercises - Seated Assisted Cervical Rotation with Towel  - 1 x daily - 7 x weekly - 1 sets - 12 reps - Seated Correct Posture  - Standing Thoracic and Shoulder Flexion/Rotation AROM at Wall: Wall Rainbow  - 1 x daily - 2 sets - 10 reps  HOME EXERCISE PROGRAM [[email or web address]] View at [email or web address] code [facility] Cervical Retraction in Sitting w/OP - MDT -  Repeat 20 Repetitions, Hold 1 Second(s), Complete 2 Sets, Perform 5 Times a Day  Retraction in Prone with Overpressure- McKenzie -  Repeat 20  Repetitions, Hold 1 Second(s), Complete 2 Sets, Perform 5 Times a Day  HOME EXERCISE PROGRAM [DJERUKS]  Thoracic Extensions on Wall -  Repeat 10 Repetitions, Hold 2 Seconds, Complete 3 Sets, Perform 3 Times a Day  ASSESSMENT:  CLINICAL IMPRESSION: Patient continues to have symptoms that appear most provoked by lower cervical spine flexion but not upper cervical spine flexion. Also worse with right rotation when lower cervical spine is flexed. Worked on exercises today to help improve motion in the upper thoracic and cervical spine to help decrease pain in this region. Updated HEP to reflect exercises targeting extension in this area more. Patient would benefit from continued management of limiting condition by skilled physical therapist to address remaining impairments and functional limitations to work towards stated goals and return to PLOF or maximal functional independence.     OBJECTIVE IMPAIRMENTS: decreased mobility, decreased ROM, decreased strength, hypomobility, improper body mechanics, postural dysfunction, and pain.   ACTIVITY LIMITATIONS: carrying, lifting, standing, and sleeping  PARTICIPATION LIMITATIONS: occupation and carpentry  PERSONAL FACTORS: Age, Behavior pattern, Fitness, Past/current experiences, and Time since onset of injury/illness/exacerbation are also affecting patient's functional outcome.   REHAB POTENTIAL: Good  CLINICAL DECISION MAKING: Stable/uncomplicated  EVALUATION COMPLEXITY: Low   GOALS: Goals reviewed with patient? No  SHORT TERM GOALS: Target date: 08/23/23  Pt will be independent with HEP to improve cervical/thoracic ROM and  pain with ADL's. Baseline: 07/26/23: provided HEP Goal status: MET  LONG TERM GOALS: Target date: 09/20/23  Pt will improve NDI by at least 8 points to demo clinically significant improvement with disability from cervicalgia.  Baseline: 07/26/23: 10/50 20% Goal status: In progress  2.  Pt will report worst pain as a  2/10 NPS or less with carpentry to demo clinically significant improvement in pain with recreational activity.  Baseline: 07/26/23: worst: 8/10 NPS; 08/23/2023: up to 5/10;  Goal status: In progress  3.  Pt will report no neck pain with 6-8 hours of sleep in side lying to improve QoL.  Baseline: 07/26/23: 3/10 NPS average with sleep; 08/23/23: does not recall having pain this week, has to adjust his position to avoid tingling;  Goal status: In progress  4.  Pt will improve cervical AROM to WNL in all planes to demonstrate improved cervical AROM and pain with ADL's.  Baseline: 07/26/23:   Active ROM A/PROM (deg) eval  Flexion 50*  Extension 45  Right lateral flexion 35*  Left lateral flexion 40  Right rotation 55  Left rotation 70   Goal status: In progress PLAN:  PT FREQUENCY: 1-2x/week  PT DURATION: 8 weeks  PLANNED INTERVENTIONS: 97164- PT Re-evaluation, 97110-Therapeutic exercises, 97530- Therapeutic activity, [id number]- Neuromuscular re-education, 97535- Self Care, 16109- Manual therapy, [id number]- Electrical stimulation (unattended), [phone]- Electrical stimulation (manual), Patient/Family education, Dry Needling, Joint mobilization, Joint manipulation, Spinal manipulation, Spinal mobilization, Cryotherapy, and Moist heat  PLAN FOR NEXT SESSION: Progress HEP as appropriate. MDT cervical retraction with overpressure (decreases sympots). Cervical and thoracic mobility, postural strengthening.    Carilyn Charles. Artemio Larry, PT, DPT 08/23/23, 10:39 AM  [facility] [address] [place], [place] [address] P: [phone] I F: [phone]

## 2023-08-25 ENCOUNTER — Ambulatory Visit: Admitting: Physical Therapy

## 2023-08-30 ENCOUNTER — Ambulatory Visit: Admitting: Physical Therapy

## 2023-09-02 ENCOUNTER — Encounter: Payer: Self-pay | Admitting: Physical Therapy

## 2023-09-02 ENCOUNTER — Ambulatory Visit: Admitting: Physical Therapy

## 2023-09-02 DIAGNOSIS — M542 Cervicalgia: Secondary | ICD-10-CM | POA: Diagnosis not present

## 2023-09-02 DIAGNOSIS — M6281 Muscle weakness (generalized): Secondary | ICD-10-CM

## 2023-09-02 DIAGNOSIS — R293 Abnormal posture: Secondary | ICD-10-CM

## 2023-09-02 NOTE — Therapy (Signed)
 OUTPATIENT PHYSICAL THERAPY DISCHARGE SUMMARY / TREATMENT Dates of reporting from 07/26/2023 to 09/02/2023   Patient Name: Scott Skinner MRN: [id number] DOB:1950/05/31, 73 y.o., male Today's Date: 09/02/2023  END OF SESSION:  PT End of Session - 09/02/23 0909     Visit Number 8    Number of Visits 17    Date for PT Re-Evaluation 09/20/23    Authorization Type MEDICARE PART B reporting period from 07/26/2023    Progress Note Due on Visit 10    PT Start Time 0904    PT Stop Time 0942    PT Time Calculation (min) 38 min    Activity Tolerance Patient tolerated treatment well    Behavior During Therapy [facility] for tasks assessed/performed          Past Medical History:  Diagnosis Date   10 year risk of MI or stroke 7.5% or greater 09/02/2022   Cancer (HCC)    prostate cancer    Chronic kidney disease    Complication of anesthesia    anaphylaxis    Diabetes mellitus without complication (HCC)    GAD (generalized anxiety disorder) 02/13/2022   GERD (gastroesophageal reflux disease)    Gout    History of kidney stones    Hyperlipidemia    Hypertension    Microvascular ischemia of myocardium    Obesity    OSA (obstructive sleep apnea)    Prostate cancer (HCC)    Umbilical hernia without obstruction and without gangrene 12/02/2021   Past Surgical History:  Procedure Laterality Date   COLONOSCOPY WITH PROPOFOL  N/A 10/05/2016   Procedure: COLONOSCOPY WITH PROPOFOL ;  Surgeon: Deveron Fly, MD;  Location[facility] ENDOSCOPY;  Service: Endoscopy;  Laterality: N/A;   COLONOSCOPY WITH PROPOFOL  N/A 10/04/2019   Procedure: COLONOSCOPY WITH PROPOFOL ;  Surgeon: Toledo, Alphonsus Jeans, MD;  Location: [facility] ENDOSCOPY;  Service: Gastroenterology;  Laterality: N/A;   CYST REMOVAL TRUNK  1977   ESOPHAGOGASTRODUODENOSCOPY (EGD) WITH PROPOFOL  N/A 10/04/2019   Procedure: ESOPHAGOGASTRODUODENOSCOPY (EGD) WITH PROPOFOL ;  Surgeon: Toledo, Alphonsus Jeans, MD;  Location: [facility] ENDOSCOPY;  Service:  Gastroenterology;  Laterality: N/A;   LEFT HEART CATH AND CORONARY ANGIOGRAPHY Left 05/17/2019   Procedure: LEFT HEART CATH AND CORONARY ANGIOGRAPHY;  Surgeon: Michelle Aid, MD;  Location: [facility] INVASIVE CV LAB;  Service: Cardiovascular;  Laterality: Left;   NECK SURGERY  2005   PROSTATE SURGERY     Patient Active Problem List   Diagnosis Date Noted   Personal history of gout 11/13/2020   History of prostate cancer 10/13/2019   Stage 3a chronic kidney disease (HCC) 06/05/2019   Microvascular ischemia of myocardium 06/01/2019   Angina pectoris (HCC) 05/10/2019   Encounter for general adult medical examination without abnormal findings 11/28/2018   Class 1 obesity due to excess calories with serious comorbidity and body mass index (BMI) of 34.0 to 34.9 in adult 07/26/2018   OSA on CPAP 11/30/2017   History of OCD (obsessive compulsive disorder) 06/01/2016   Mixed hypercholesterolemia and hypertriglyceridemia 04/20/2016   GERD without esophagitis 01/13/2016   Attention deficit disorder without hyperactivity 11/12/2015   Hypertension 05/14/2015   Diabetes mellitus type 2, uncontrolled, without complications 05/14/2015   Hypercalcemia 05/14/2015   Abdominal pain, LLQ (left lower quadrant) 04/23/2015   Male erectile dysfunction, unspecified 10/21/2012   Incomplete bladder emptying 10/21/2012   Malignant neoplasm of prostate (HCC) 10/21/2012   Testicular hypofunction 10/21/2012   Bladder outlet obstruction 10/21/2012    PCP: Monique Ano, MD  REFERRING PROVIDER: Pool,  Ace Abu, MD  REFERRING DIAG: [phone] (ICD-10-CM) - Radiculopathy, cervical region  THERAPY DIAG:  Cervicalgia  Abnormal posture  Muscle weakness (generalized)  Rationale for Evaluation and Treatment: Rehabilitation  ONSET DATE: march 2024  SUBJECTIVE:                                                                                                                                                                                                          PERTINENT HISTORY:  Pt reports 2 months of R sided neck pain and N/T in his neck. 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 He reports being a R/L side sleeper. Reports a lot of cervical flexion when wood working causing his pain. Has seen a chiropractor for L sided neck pain and has since stopped. Does endorse R sided headaches and L sided as well in rams head distribution R > L. Has tingling from upper trap/mastoid to distal upper trap. No UE N/T however. Worst pain 8/10 NPS, best pain, 0/10 NPS. Averages 3/10 NPS. Pain currently 3/10 NPS. Pain described as tightness primarily, also dull and achey. Pain improved with rest, keeping neck in a neutral position, massaging his neck muscles. Heat/ice helps.   RED FLAGS: Cervical red flags: Dysphagia No, Dysmetria No, Diplopia No, Nystagmus No, and Nausea No, Bowel or bladder incontinence: No, and Cauda equina syndrome: No     SUBJECTIVE STATEMENT: Patient states he saw a new doctor (Dr. Alpha Arts in pain management) that Dr. Lawyer Pride office referred him to when he called them about his neck not improving with PT. He has injections set up to help determine if an ablation is an option for him. He was told that continued PT will not help if something is pushing on the nerve and he feels he gets no lasting relief from PT. He would like to discharge from PT today. He understands his HEP and gets temporary relief from it. He gets temporary relief from joint mobilizations and would like to do that today.   PAIN:  NPRS scale: 3/10 R > L cervical spine with some tingling over the right  Pain location: R cervical paraspinals, upper trapezius.  Pain description: dull/achey/tingling Aggravating factors: cervical flexion, heavy lifting, sleep Relieving factors: rest/ice/heat  PRECAUTIONS: None  PATIENT GOALS: improve his pain and get it controlled  NEXT MD VISIT: N/A  OBJECTIVE:   PATIENT SURVEYS:  NDI  10/50  CERVICAL SPINE AROM:    Active ROM AROM (deg) 07/26/23 AROM (Deg) 09/02/23  Flexion 50* 48  Extension 45 40  Right lateral flexion 35* 22*  Left lateral flexion 40 24  Right rotation 55 54*  Left rotation 70 60   (Blank rows = not tested) 09/02/23: concordant tightness with end range flexion, extension, left side-bending  TREATMENT                                                                                                                                Therapeutic exercise: therapeutic exercises that incorporate ONE parameter at one or more areas of the body to centralize symptoms, develop strength and endurance, range of motion, and flexibility required for successful completion of functional activities.  Measurements to assess change from initial evaluation (see above).   Manual therapy: to reduce pain and tissue tension, improve range of motion, neuromodulation, in order to promote improved ability to complete functional activities. PRONE Bilateral UPA C2-T1, 1x30 seconds each side each level, grade III-IV STM to R > L UT, LS   PATIENT EDUCATION:  Education details: Dentist. Discharge recommendations.  Person educated: Patient Education method: Explanation Education comprehension: verbalized understanding  HOME EXERCISE PROGRAM: Access Code: [email or web address] URL: [email or web address] Date: 08/23/2023 Prepared by: Alleen Isle  Exercises - Seated Assisted Cervical Rotation with Towel  - 1 x daily - 7 x weekly - 1 sets - 12 reps - Seated Correct Posture  - Standing Thoracic and Shoulder Flexion/Rotation AROM at Wall: Wall Rainbow  - 1 x daily - 2 sets - 10 reps  HOME EXERCISE PROGRAM [[email or web address]] View at [email or web address] code [facility] [place] Cervical Retraction in Sitting w/OP - MDT -  Repeat 20 Repetitions, Hold 1 Second(s), Complete 2 Sets, Perform 5 Times a Day  Retraction in Prone with Overpressure- McKenzie -  Repeat 20 Repetitions,  Hold 1 Second(s), Complete 2 Sets, Perform 5 Times a Day  HOME EXERCISE PROGRAM [DJERUKS]  Thoracic Extensions on Wall -  Repeat 10 Repetitions, Hold 2 Seconds, Complete 3 Sets, Perform 3 Times a Day  ASSESSMENT:  CLINICAL IMPRESSION: Patient has attended 8 physical therapy sessions without lasting relief of symptoms and with minimal to no improvement in all long term goals. He has been provided with a long term HEP that provides some temporary relief. He plans to pursue injections and possible nerve ablation as recommended by his doctor. He is now discharged from PT due to lack of improvement with PT.     OBJECTIVE IMPAIRMENTS: decreased mobility, decreased ROM, decreased strength, hypomobility, improper body mechanics, postural dysfunction, and pain.   ACTIVITY LIMITATIONS: carrying, lifting, standing, and sleeping  PARTICIPATION LIMITATIONS: occupation and carpentry  PERSONAL FACTORS: Age, Behavior pattern, Fitness, Past/current experiences, and Time since onset of injury/illness/exacerbation are also affecting patient's functional outcome.   REHAB POTENTIAL: Good  CLINICAL DECISION MAKING: Stable/uncomplicated  EVALUATION COMPLEXITY: Low   GOALS: Goals reviewed with patient? No  SHORT TERM GOALS: Target date: 08/23/23  Pt will be independent with HEP to improve cervical/thoracic ROM and pain with ADL's. Baseline: 07/26/23: provided HEP Goal status: MET  LONG  TERM GOALS: Target date: 09/20/23  Pt will improve NDI by at least 8 points to demo clinically significant improvement with disability from cervicalgia.  Baseline: 07/26/23: 10/50 20%; 09/02/2023: 19/50 38%. Goal status: Not MET  2.  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  Baseline: 07/26/23: worst: 8/10 NPS; 08/23/2023: up to 5/10; 09/02/23 7/10 when I let it go too far Goal status: Not MET  3.  Pt will report no neck  pain with 6-8 hours of sleep in side lying to improve QoL.  Baseline: 07/26/23: 3/10 NPS average with sleep; 08/23/23: does not recall having pain this week, has to adjust his position to avoid tingling; 09/02/2023: 5/10 NPS during 6-7 hours of sleep.  Goal status: Not MET  4.  Pt will improve cervical AROM to WNL in all planes to demonstrate improved cervical AROM and pain with ADL's.  Baseline: 07/26/23:   Active ROM AROM (deg) 07/26/23 AROM (Deg) 09/02/23  Flexion 50* 48  Extension 45 40  Right lateral flexion 35* 22*  Left lateral flexion 40 24  Right rotation 55 54*  Left rotation 70 60   (Blank rows = not tested) 09/02/23: concordant tightness with end range flexion, extension, left side-bending  Goal status: Not MET PLAN:  PT FREQUENCY: 1-2x/week  PT DURATION: 8 weeks  PLANNED INTERVENTIONS: 97164- PT Re-evaluation, 97110-Therapeutic exercises, 97530- Therapeutic activity, 97112- Neuromuscular re-education, 97535- Self Care, 54098- Manual therapy, G0283- Electrical stimulation (unattended), 11914- Electrical stimulation (manual), Patient/Family education, Dry Needling, Joint mobilization, Joint manipulation, Spinal manipulation, Spinal mobilization, Cryotherapy, and Moist heat  PLAN FOR NEXT SESSION: Patient is discharged from PT   Mrytle Bento R. Artemio Larry, PT, DPT 09/02/23, 10:05 AM  [facility] [address] [place], [place] [address] P: [phone] I F: [phone]

## 2023-09-06 ENCOUNTER — Ambulatory Visit: Admitting: Physical Therapy

## 2023-09-13 ENCOUNTER — Encounter: Admitting: Physical Therapy

## 2023-09-16 ENCOUNTER — Encounter: Admitting: Physical Therapy

## 2023-09-21 ENCOUNTER — Encounter: Admitting: Physical Therapy

## 2023-09-23 ENCOUNTER — Encounter: Admitting: Physical Therapy

## 2023-09-28 ENCOUNTER — Encounter: Admitting: Physical Therapy

## 2023-09-30 ENCOUNTER — Encounter: Admitting: Physical Therapy

## 2023-10-05 ENCOUNTER — Encounter: Admitting: Physical Therapy

## 2023-10-07 ENCOUNTER — Encounter: Admitting: Physical Therapy

## 2023-10-12 ENCOUNTER — Encounter: Admitting: Physical Therapy

## 2023-10-14 ENCOUNTER — Encounter: Admitting: Physical Therapy

## 2024-01-28 ENCOUNTER — Other Ambulatory Visit: Payer: Self-pay | Admitting: Internal Medicine

## 2024-01-28 DIAGNOSIS — E1169 Type 2 diabetes mellitus with other specified complication: Secondary | ICD-10-CM

## 2024-01-28 DIAGNOSIS — G4733 Obstructive sleep apnea (adult) (pediatric): Secondary | ICD-10-CM

## 2024-01-28 DIAGNOSIS — I10 Essential (primary) hypertension: Secondary | ICD-10-CM

## 2024-01-28 DIAGNOSIS — E782 Mixed hyperlipidemia: Secondary | ICD-10-CM

## 2024-02-08 ENCOUNTER — Ambulatory Visit
Admission: RE | Admit: 2024-02-08 | Discharge: 2024-02-08 | Disposition: A | Discharge status: Home / Self Care | Payer: Self-pay | Source: Ambulatory Visit | Attending: Internal Medicine | Admitting: Internal Medicine

## 2024-02-08 DIAGNOSIS — E1169 Type 2 diabetes mellitus with other specified complication: Secondary | ICD-10-CM | POA: Insufficient documentation

## 2024-02-08 DIAGNOSIS — E785 Hyperlipidemia, unspecified: Secondary | ICD-10-CM | POA: Insufficient documentation

## 2024-02-08 DIAGNOSIS — I10 Essential (primary) hypertension: Secondary | ICD-10-CM | POA: Insufficient documentation

## 2024-02-08 DIAGNOSIS — G4733 Obstructive sleep apnea (adult) (pediatric): Secondary | ICD-10-CM | POA: Insufficient documentation

## 2024-02-08 DIAGNOSIS — E782 Mixed hyperlipidemia: Secondary | ICD-10-CM | POA: Insufficient documentation
